# Patient Record
Sex: Male | Born: 1945 | Race: White | Hispanic: No | Marital: Married | Smoking: Former smoker
Health system: Southern US, Community
[De-identification: ages and names within clinical notes are randomized; demographics above are authoritative.]

## PROBLEM LIST (undated history)

## (undated) DIAGNOSIS — C679 Malignant neoplasm of bladder, unspecified: Secondary | ICD-10-CM

## (undated) DIAGNOSIS — I2510 Atherosclerotic heart disease of native coronary artery without angina pectoris: Secondary | ICD-10-CM

## (undated) DIAGNOSIS — N400 Enlarged prostate without lower urinary tract symptoms: Secondary | ICD-10-CM

## (undated) DIAGNOSIS — Z86711 Personal history of pulmonary embolism: Secondary | ICD-10-CM

## (undated) DIAGNOSIS — K551 Chronic vascular disorders of intestine: Secondary | ICD-10-CM

## (undated) DIAGNOSIS — I10 Essential (primary) hypertension: Secondary | ICD-10-CM

## (undated) DIAGNOSIS — E785 Hyperlipidemia, unspecified: Secondary | ICD-10-CM

## (undated) DIAGNOSIS — C8190 Hodgkin lymphoma, unspecified, unspecified site: Secondary | ICD-10-CM

## (undated) DIAGNOSIS — K219 Gastro-esophageal reflux disease without esophagitis: Secondary | ICD-10-CM

## (undated) HISTORY — DX: Benign prostatic hyperplasia without lower urinary tract symptoms: N40.0

## (undated) HISTORY — DX: Chronic vascular disorders of intestine: K55.1

## (undated) HISTORY — DX: Gastro-esophageal reflux disease without esophagitis: K21.9

## (undated) HISTORY — DX: Personal history of pulmonary embolism: Z86.711

## (undated) HISTORY — DX: Essential (primary) hypertension: I10

## (undated) HISTORY — DX: Hyperlipidemia, unspecified: E78.5

## (undated) HISTORY — PX: APPENDECTOMY: SHX54

## (undated) HISTORY — PX: KNEE ARTHROPLASTY: SHX992

## (undated) HISTORY — DX: Malignant neoplasm of bladder, unspecified: C67.9

## (undated) HISTORY — DX: Hodgkin lymphoma, unspecified, unspecified site: C81.90

---

## 1978-04-04 HISTORY — PX: SPLENECTOMY: SUR1306

## 2009-04-04 DIAGNOSIS — I2510 Atherosclerotic heart disease of native coronary artery without angina pectoris: Secondary | ICD-10-CM

## 2009-04-04 HISTORY — PX: CORONARY ARTERY BYPASS GRAFT: SHX141

## 2009-04-04 HISTORY — DX: Atherosclerotic heart disease of native coronary artery without angina pectoris: I25.10

## 2014-04-04 HISTORY — PX: MELANOMA EXCISION: SHX5266

## 2014-04-15 ENCOUNTER — Ambulatory Visit (INDEPENDENT_AMBULATORY_CARE_PROVIDER_SITE_OTHER): Payer: Medicare Other | Admitting: Gastroenterology

## 2014-04-15 ENCOUNTER — Encounter: Payer: Self-pay | Admitting: Gastroenterology

## 2014-04-15 VITALS — BP 110/70 | HR 60 | Ht 71.0 in | Wt 205.0 lb

## 2014-04-15 DIAGNOSIS — Z1211 Encounter for screening for malignant neoplasm of colon: Secondary | ICD-10-CM

## 2014-04-15 DIAGNOSIS — R112 Nausea with vomiting, unspecified: Secondary | ICD-10-CM | POA: Insufficient documentation

## 2014-04-15 DIAGNOSIS — Z87898 Personal history of other specified conditions: Secondary | ICD-10-CM | POA: Insufficient documentation

## 2014-04-15 DIAGNOSIS — R1013 Epigastric pain: Secondary | ICD-10-CM | POA: Insufficient documentation

## 2014-04-15 MED ORDER — ONDANSETRON 4 MG PO TBDP: 4.0000 mg | ORAL_TABLET | Freq: Three times a day (TID) | ORAL | Status: DC | PRN

## 2014-04-15 MED ORDER — MOVIPREP 100 G PO SOLR: 1.0000 | Freq: Once | ORAL | Status: DC

## 2014-04-15 NOTE — Progress Notes (Signed)
04/15/2014 Matthew Butler [id number] May 22, 1945   HISTORY OF PRESENT ILLNESS:  This is a pleasant 69 year old male who is new to our practice and presents to our office today with his wife with complaints of episodic epigastric abdominal pain and nausea.  He tells me that he was in his normal state of health until October when one night he woke up in the middle the night with severe epigastric abdominal pain. This lasted for a few hours before subsiding. The pain completely resolved and then he was feeling fine until symptoms recurred right after Christmas. Once again symptoms woke him from sleep at night with significant abdominal pain. Once again they improved after just a few hours until a third episode that occurred this past Friday. This time the pain lasted much longer. The pain has been associated with some nausea and a lot of belching. He did see his PCP on Saturday at which time some labs and an H. Pylori stool study were performed, however, those results are not back yet. He was placed on Nexium daily and Zantac at bedtime. He says that overall the pain has improved at this point be still feels nauseous and does not feel like eating. He has been eating bland food and drinking liquids, but even liquids make him nauseous at times. He denies any actual vomiting. He has been moving his bowels and passing flatus without issues.  Has lost 8 pounds since Friday due to not eating much, but denies any weight loss prior to this.  He reports that he does have a history of ulcer disease several years ago that were bleeding ulcers. He reports that he had H. Pylori infection which was treated at that time, however. He denies any NSAID use. He also has a past medical history of Hodgkin's lymphoma several years ago and has a large scar in his abdomen from a surgery that he had at that time for "cancer staging".   Past Medical History  Diagnosis Date  . Hodgkin disease    Past Surgical History  Procedure  Laterality Date  . Coronary artery bypass graft    . Knee arthroplasty    . Appendectomy      reports that he has quit smoking. His smoking use included Cigars. He has never used smokeless tobacco. He reports that he drinks alcohol. He reports that he does not use illicit drugs. family history includes Bowel Disease in his father; Congestive Heart Failure in his mother; Melanoma in his maternal grandmother. No Known Allergies    Outpatient Encounter Prescriptions as of 04/15/2014  Medication Sig  . aspirin 81 MG tablet Take 81 mg by mouth daily.  Marland Kitchen esomeprazole (NEXIUM) 20 MG capsule Take 40 mg by mouth daily at 12 noon.  . finasteride (PROSCAR) 5 MG tablet   . HYDROcodone-acetaminophen (NORCO/VICODIN) 5-325 MG per tablet   . lidocaine (XYLOCAINE) 2 % solution   . methylPREDNIsolone (MEDROL DOSPACK) 4 MG tablet   . metoprolol succinate (TOPROL-XL) 25 MG 24 hr tablet   . MOVIPREP 100 G SOLR Take 1 kit (200 g total) by mouth once.  . ondansetron (ZOFRAN ODT) 4 MG disintegrating tablet Take 1 tablet (4 mg total) by mouth every 8 (eight) hours as needed for nausea or vomiting.  . ranitidine (ZANTAC) 150 MG tablet   . simvastatin (ZOCOR) 40 MG tablet   . tamsulosin (FLOMAX) 0.4 MG CAPS capsule      REVIEW OF SYSTEMS  : All other systems reviewed and negative  except where noted in the History of Present Illness.   PHYSICAL EXAM: BP 110/70 mmHg  Pulse 60  Ht _0  (1.803 m)  Wt 205 lb (92.987 kg)  BMI 28.60 kg/m2 General: Well developed white male in no acute distress Head: Normocephalic and atraumatic Eyes:  Sclerae anicteric, conjunctiva pink. Ears: Normal auditory acuity Lungs: Clear throughout to auscultation Heart: Regular rate and rhythm Abdomen: Soft, non-distended.  Normal bowel sounds.  Mild epigastric TTP without R/R/G.  Mid-abdominal scar from previous surgery noted. Rectal:  Will be done at the time of colonoscopy. Musculoskeletal: Symmetrical with no gross  deformities  Skin: No lesions on visible extremities Extremities: No edema  Neurological: Alert oriented x 4, grossly non-focal Psychological:  Alert and cooperative. Normal mood and affect  ASSESSMENT AND PLAN: -Intermittent epigastric abdominal pain and nausea:  Sounds almost like biliary colic, but he does have a history of ulcer disease which were probably due to Hpylori infection in the past that was treated; denies NSAID use.  Also, has large mid-abdominal incision so it is possible that he is having PSBO intermittently as well.  Will check abdominal ultrasound and schedule EGD as well.  He will continue the Nexium daily and Zantac at bedtime that he is currently taking.  Will give him zofran to use prn for nausea.  May need HIDA scan vs CT scan pending results of the above studies.  We will also try to obtain lab results that were performed at his PCP's office this past weekend. -Screening colonoscopy:  Never had colonoscopy in the past.  Will schedule with Dr. Henrene Pastor.  *The risks, benefits, and alternatives to EGD and colonoscopy were discussed with the patient and he consents to proceed.

## 2014-04-15 NOTE — Progress Notes (Signed)
Agree with initial assessment and plans as outlined 

## 2014-04-15 NOTE — Patient Instructions (Addendum)
We have sent the following medications to your pharmacy for you to pick up at your convenience: Zofran You have been scheduled for an endoscopy and colonoscopy. Please follow the written instructions given to you at your visit today. Please pick up your prep at the pharmacy within the next 1-3 days. If you use inhalers (even only as needed), please bring them with you on the day of your procedure. Your physician has requested that you go to www.startemmi.com and enter the access code given to you at your visit today. This web site gives a general overview about your procedure. However, you should still follow specific instructions given to you by our office regarding your preparation for the procedure. You have been scheduled for an abdominal ultrasound at [facility] (1st floor of hospital) on 04/18/14 at 10 am. Please arrive 15 minutes prior to your appointment for registration. Make certain not to have anything to eat or drink 6 hours prior to your appointment. Should you need to reschedule your appointment, please contact radiology at [phone]. This test typically takes about 30 minutes to perform.

## 2014-04-16 ENCOUNTER — Emergency Department (HOSPITAL_COMMUNITY): Payer: Medicare Other

## 2014-04-16 ENCOUNTER — Inpatient Hospital Stay (HOSPITAL_COMMUNITY)
Admission: EM | Admit: 2014-04-16 | Discharge: 2014-04-20 | DRG: 418 | Disposition: A | Discharge status: Home / Self Care | Payer: Medicare Other | Attending: General Surgery | Admitting: General Surgery

## 2014-04-16 ENCOUNTER — Encounter (HOSPITAL_COMMUNITY): Payer: Self-pay | Admitting: *Deleted

## 2014-04-16 DIAGNOSIS — K8000 Calculus of gallbladder with acute cholecystitis without obstruction: Principal | ICD-10-CM | POA: Diagnosis present

## 2014-04-16 DIAGNOSIS — I10 Essential (primary) hypertension: Secondary | ICD-10-CM | POA: Diagnosis present

## 2014-04-16 DIAGNOSIS — Z8249 Family history of ischemic heart disease and other diseases of the circulatory system: Secondary | ICD-10-CM

## 2014-04-16 DIAGNOSIS — N179 Acute kidney failure, unspecified: Secondary | ICD-10-CM

## 2014-04-16 DIAGNOSIS — K219 Gastro-esophageal reflux disease without esophagitis: Secondary | ICD-10-CM

## 2014-04-16 DIAGNOSIS — R109 Unspecified abdominal pain: Secondary | ICD-10-CM

## 2014-04-16 DIAGNOSIS — E785 Hyperlipidemia, unspecified: Secondary | ICD-10-CM

## 2014-04-16 DIAGNOSIS — Z808 Family history of malignant neoplasm of other organs or systems: Secondary | ICD-10-CM | POA: Diagnosis not present

## 2014-04-16 DIAGNOSIS — Z87891 Personal history of nicotine dependence: Secondary | ICD-10-CM

## 2014-04-16 DIAGNOSIS — R740 Nonspecific elevation of levels of transaminase and lactic acid dehydrogenase [LDH]: Secondary | ICD-10-CM

## 2014-04-16 DIAGNOSIS — Z9081 Acquired absence of spleen: Secondary | ICD-10-CM | POA: Diagnosis present

## 2014-04-16 DIAGNOSIS — Z8571 Personal history of Hodgkin lymphoma: Secondary | ICD-10-CM

## 2014-04-16 DIAGNOSIS — R7401 Elevation of levels of liver transaminase levels: Secondary | ICD-10-CM

## 2014-04-16 DIAGNOSIS — Z7982 Long term (current) use of aspirin: Secondary | ICD-10-CM

## 2014-04-16 DIAGNOSIS — Z951 Presence of aortocoronary bypass graft: Secondary | ICD-10-CM

## 2014-04-16 DIAGNOSIS — I25810 Atherosclerosis of coronary artery bypass graft(s) without angina pectoris: Secondary | ICD-10-CM | POA: Diagnosis present

## 2014-04-16 DIAGNOSIS — N400 Enlarged prostate without lower urinary tract symptoms: Secondary | ICD-10-CM

## 2014-04-16 DIAGNOSIS — Z01818 Encounter for other preprocedural examination: Secondary | ICD-10-CM

## 2014-04-16 DIAGNOSIS — K819 Cholecystitis, unspecified: Secondary | ICD-10-CM

## 2014-04-16 DIAGNOSIS — R1011 Right upper quadrant pain: Secondary | ICD-10-CM | POA: Diagnosis present

## 2014-04-16 DIAGNOSIS — K810 Acute cholecystitis: Secondary | ICD-10-CM | POA: Diagnosis present

## 2014-04-16 HISTORY — DX: Atherosclerotic heart disease of native coronary artery without angina pectoris: I25.10

## 2014-04-16 LAB — COMPREHENSIVE METABOLIC PANEL
ALT: 69 U/L — ABNORMAL HIGH (ref 0–53)
AST: 130 U/L — ABNORMAL HIGH (ref 0–37)
Albumin: 3.7 g/dL (ref 3.5–5.2)
Alkaline Phosphatase: 147 U/L — ABNORMAL HIGH (ref 39–117)
Anion gap: 6 (ref 5–15)
BUN: 20 mg/dL (ref 6–23)
CO2: 30 mmol/L (ref 19–32)
Calcium: 8.8 mg/dL (ref 8.4–10.5)
Chloride: 100 mEq/L (ref 96–112)
Creatinine, Ser: 1.4 mg/dL — ABNORMAL HIGH (ref 0.50–1.35)
GFR calc Af Amer: 58 mL/min — ABNORMAL LOW (ref >90)
GFR calc non Af Amer: 50 mL/min — ABNORMAL LOW (ref >90)
Glucose, Bld: 105 mg/dL — ABNORMAL HIGH (ref 70–99)
Potassium: 4.1 mmol/L (ref 3.5–5.1)
Sodium: 136 mmol/L (ref 135–145)
Total Bilirubin: 1.4 mg/dL — ABNORMAL HIGH (ref 0.3–1.2)
Total Protein: 7.6 g/dL (ref 6.0–8.3)

## 2014-04-16 LAB — CBC WITH DIFFERENTIAL/PLATELET
Basophils Absolute: 0 10*3/uL (ref 0.0–0.1)
Basophils Relative: 0 % (ref 0–1)
Eosinophils Absolute: 0.1 10*3/uL (ref 0.0–0.7)
Eosinophils Relative: 0 % (ref 0–5)
HCT: 42.6 % (ref 39.0–52.0)
Hemoglobin: 14.4 g/dL (ref 13.0–17.0)
Lymphocytes Relative: 6 % — ABNORMAL LOW (ref 12–46)
Lymphs Abs: 1.1 10*3/uL (ref 0.7–4.0)
MCH: 31.5 pg (ref 26.0–34.0)
MCHC: 33.8 g/dL (ref 30.0–36.0)
MCV: 93.2 fL (ref 78.0–100.0)
Monocytes Absolute: 1.7 10*3/uL — ABNORMAL HIGH (ref 0.1–1.0)
Monocytes Relative: 9 % (ref 3–12)
Neutro Abs: 16 10*3/uL — ABNORMAL HIGH (ref 1.7–7.7)
Neutrophils Relative %: 85 % — ABNORMAL HIGH (ref 43–77)
Platelets: 390 10*3/uL (ref 150–400)
RBC: 4.57 MIL/uL (ref 4.22–5.81)
RDW: 13.8 % (ref 11.5–15.5)
WBC: 18.9 10*3/uL — ABNORMAL HIGH (ref 4.0–10.5)

## 2014-04-16 LAB — LIPASE, BLOOD: Lipase: 26 U/L (ref 11–59)

## 2014-04-16 LAB — ETHANOL: Alcohol, Ethyl (B): <5 mg/dL (ref 0–9)

## 2014-04-16 MED ORDER — SODIUM CHLORIDE 0.9 % IV SOLN
3.0000 g | Freq: Three times a day (TID) | INTRAVENOUS | Status: DC
  Administered 2014-04-17 – 2014-04-20 (×11): 3 g via INTRAVENOUS
  Filled 2014-04-16 (×14): qty 3

## 2014-04-16 MED ORDER — ONDANSETRON HCL 4 MG/2ML IJ SOLN
4.0000 mg | Freq: Once | INTRAMUSCULAR | Status: AC
  Administered 2014-04-16: 4 mg via INTRAVENOUS
  Filled 2014-04-16: qty 2

## 2014-04-16 MED ORDER — PANTOPRAZOLE SODIUM 40 MG PO TBEC
40.0000 mg | DELAYED_RELEASE_TABLET | Freq: Every day | ORAL | Status: DC
  Administered 2014-04-17 – 2014-04-20 (×4): 40 mg via ORAL
  Filled 2014-04-16 (×4): qty 1

## 2014-04-16 MED ORDER — ONDANSETRON HCL 4 MG/2ML IJ SOLN: 4.0000 mg | Freq: Three times a day (TID) | INTRAMUSCULAR | Status: DC | PRN

## 2014-04-16 MED ORDER — MORPHINE SULFATE 4 MG/ML IJ SOLN
2.0000 mg | INTRAMUSCULAR | Status: DC | PRN
  Administered 2014-04-16: 2 mg via INTRAVENOUS
  Filled 2014-04-16: qty 1

## 2014-04-16 MED ORDER — HYDROMORPHONE HCL 1 MG/ML IJ SOLN
0.5000 mg | INTRAMUSCULAR | Status: AC | PRN
Start: 2014-04-16 — End: 2014-04-17
  Administered 2014-04-17: 0.5 mg via INTRAVENOUS
  Filled 2014-04-16: qty 1

## 2014-04-16 MED ORDER — SIMVASTATIN 20 MG PO TABS
40.0000 mg | ORAL_TABLET | Freq: Every day | ORAL | Status: DC
  Administered 2014-04-17: 40 mg via ORAL
  Filled 2014-04-16: qty 2

## 2014-04-16 MED ORDER — TAMSULOSIN HCL 0.4 MG PO CAPS
0.4000 mg | ORAL_CAPSULE | Freq: Every day | ORAL | Status: DC
  Administered 2014-04-17 – 2014-04-20 (×4): 0.4 mg via ORAL
  Filled 2014-04-16 (×4): qty 1

## 2014-04-16 MED ORDER — SODIUM CHLORIDE 0.9 % IV SOLN
INTRAVENOUS | Status: DC
  Administered 2014-04-16 – 2014-04-17 (×3): via INTRAVENOUS

## 2014-04-16 MED ORDER — SENNA 8.6 MG PO TABS
1.0000 | ORAL_TABLET | Freq: Two times a day (BID) | ORAL | Status: DC
  Administered 2014-04-16 – 2014-04-20 (×6): 8.6 mg via ORAL
  Filled 2014-04-16 (×12): qty 1

## 2014-04-16 MED ORDER — ASPIRIN EC 81 MG PO TBEC: 81.0000 mg | DELAYED_RELEASE_TABLET | Freq: Every day | ORAL | Status: DC

## 2014-04-16 MED ORDER — SODIUM CHLORIDE 0.9 % IV SOLN
Freq: Once | INTRAVENOUS | Status: AC
  Administered 2014-04-16: 17:00:00 via INTRAVENOUS

## 2014-04-16 MED ORDER — SODIUM CHLORIDE 0.9 % IV SOLN
3.0000 g | Freq: Once | INTRAVENOUS | Status: AC
  Administered 2014-04-16: 3 g via INTRAVENOUS
  Filled 2014-04-16: qty 3

## 2014-04-16 MED ORDER — AMPICILLIN-SULBACTAM SODIUM 3 (2-1) G IJ SOLR
INTRAMUSCULAR | Status: AC
  Filled 2014-04-16: qty 3

## 2014-04-16 MED ORDER — ASPIRIN EC 81 MG PO TBEC
81.0000 mg | DELAYED_RELEASE_TABLET | Freq: Every day | ORAL | Status: DC
  Administered 2014-04-17: 81 mg via ORAL
  Filled 2014-04-16: qty 1

## 2014-04-16 MED ORDER — SODIUM CHLORIDE 0.9 % IV SOLN
3.0000 g | Freq: Three times a day (TID) | INTRAVENOUS | Status: DC
  Filled 2014-04-16 (×2): qty 3

## 2014-04-16 MED ORDER — MORPHINE SULFATE 4 MG/ML IJ SOLN: 4.0000 mg | INTRAMUSCULAR | Status: DC | PRN

## 2014-04-16 MED ORDER — PANTOPRAZOLE SODIUM 40 MG PO TBEC: 40.0000 mg | DELAYED_RELEASE_TABLET | Freq: Every day | ORAL | Status: DC

## 2014-04-16 MED ORDER — FINASTERIDE 5 MG PO TABS
5.0000 mg | ORAL_TABLET | Freq: Every day | ORAL | Status: DC
Start: 2014-04-17 — End: 2014-04-20
  Administered 2014-04-17 – 2014-04-20 (×4): 5 mg via ORAL
  Filled 2014-04-16 (×7): qty 1

## 2014-04-16 MED ORDER — POLYETHYLENE GLYCOL 3350 17 G PO PACK
17.0000 g | PACK | Freq: Every day | ORAL | Status: DC | PRN
  Administered 2014-04-20: 17 g via ORAL
  Filled 2014-04-16: qty 1

## 2014-04-16 MED ORDER — HEPARIN SODIUM (PORCINE) 5000 UNIT/ML IJ SOLN
5000.0000 [IU] | Freq: Three times a day (TID) | INTRAMUSCULAR | Status: DC
  Administered 2014-04-16 – 2014-04-17 (×4): 5000 [IU] via SUBCUTANEOUS
  Filled 2014-04-16 (×4): qty 1

## 2014-04-16 MED ORDER — METOPROLOL SUCCINATE ER 25 MG PO TB24
25.0000 mg | ORAL_TABLET | Freq: Every day | ORAL | Status: DC
Start: 2014-04-17 — End: 2014-04-20
  Administered 2014-04-17 – 2014-04-20 (×3): 25 mg via ORAL
  Filled 2014-04-16 (×5): qty 1

## 2014-04-16 NOTE — ED Notes (Addendum)
RUQ pain with nausea at times. Pt sent from [facility] in [place] due to suspecting cholecystitis. Toradol 60mg  IM was given in the office. Pt has GBUS scheduled for Friday. Pt has lab work from [facility].

## 2014-04-16 NOTE — ED Notes (Signed)
Pt reporting mild nausea, but improvement in pain levels.  VS stable, no distress noted at present time.

## 2014-04-16 NOTE — H&P (Signed)
[facility] History and Physical  Ziv Welchel TMH:[id number] DOB: [birth year] DOA: 04/16/2014  Referring physician: Dr. Sabra Heck - [facility] PCP: PROVIDER NOT IN SYSTEM   Chief Complaint: abd pain  HPI: Matthew Butler is a 69 y.o. male  Abdominal pain started 1 wk ago. RUQ. No radiation.  Getting worse. Comes and goes - lasts 2-3 hours. Associate w/ nausea. Worse w/ food. Current episode started around 14:00. Typically pain is at night. Tried toradol w/ mild relief. Went to UC and told WBC 15 and needed to come to ED. H/o Hodgkin dlymphoma s/p splenectomy and appendectomy.   Review of Systems:  Constitutional:  No weight loss, night sweats, Fevers, chills  HEENT:  No headaches, Difficulty swallowing,Tooth/dental problems,Sore throat,  No sneezing, itching, ear ache, nasal congestion, post nasal drip,  Cardio-vascular:  No chest pain, Orthopnea, PND, swelling in lower extremities, anasarca, dizziness, palpitations  GI:  Per HPI Resp:   No shortness of breath with exertion or at rest. No excess mucus, no productive cough, No non-productive cough, No coughing up of blood.No change in color of mucus.No wheezing.No chest wall deformity  Skin:  no rash or lesions.  GU:  no dysuria, change in color of urine, no urgency or frequency. No flank pain.  Musculoskeletal:   No joint pain or swelling. No decreased range of motion. No back pain.  Psych:  No change in mood or affect. No depression or anxiety. No memory loss.   Past Medical History  Diagnosis Date  . Hodgkin disease    Past Surgical History  Procedure Laterality Date  . Coronary artery bypass graft    . Knee arthroplasty    . Appendectomy    . Coronary artery bypass graft     Social History:  reports that he has quit smoking. His smoking use included Cigars. He has never used smokeless tobacco. He reports that he drinks alcohol. He reports that he does not use illicit drugs.  No Known Allergies  Family History  Problem  Relation Age of Onset  . Melanoma Maternal Grandmother   . Congestive Heart Failure Mother   . Bowel Disease Father      Prior to Admission medications   Medication Sig Start Date End Date Taking? Authorizing Provider  aspirin EC 81 MG tablet Take 81 mg by mouth daily.   Yes Historical Provider, MD  esomeprazole (NEXIUM) 20 MG capsule Take 40 mg by mouth daily.    Yes Historical Provider, MD  finasteride (PROSCAR) 5 MG tablet  02/24/14  Yes Historical Provider, MD  metoprolol succinate (TOPROL-XL) 25 MG 24 hr tablet Take 25 mg by mouth daily.  02/24/14  Yes Historical Provider, MD  ranitidine (ZANTAC) 150 MG tablet  04/12/14  Yes Historical Provider, MD  simvastatin (ZOCOR) 40 MG tablet  02/24/14  Yes Historical Provider, MD  tamsulosin (FLOMAX) 0.4 MG CAPS capsule  02/24/14  Yes Historical Provider, MD  HYDROcodone-acetaminophen (NORCO/VICODIN) 5-325 MG per tablet  03/05/14   Historical Provider, MD  MOVIPREP 100 G SOLR Take 1 kit (200 g total) by mouth once. 04/15/14   Janett Billow D. Zehr, PA-C  ondansetron (ZOFRAN ODT) 4 MG disintegrating tablet Take 1 tablet (4 mg total) by mouth every 8 (eight) hours as needed for nausea or vomiting. 04/15/14   Laban Emperor. Zehr, PA-C   Physical Exam: Filed Vitals:   04/16/14 1611 04/16/14 1917  BP: 108/60 114/56  Pulse: 111   Temp: 98 F (36.7 C) 98.9 F (37.2 C)  TempSrc: Oral Oral  Resp: 16 20  Height: '5\' 11"'  (1.803 m)   Weight: 89.812 kg (198 lb)   SpO2: 96% 97%    Wt Readings from Last 3 Encounters:  04/16/14 89.812 kg (198 lb)  04/15/14 92.987 kg (205 lb)    General:  Appears calm and comfortable Eyes:  PERRL, normal lids, irises & conjunctiva ENT:  grossly normal hearing, lips & tongue Neck:  no LAD, masses or thyromegaly Cardiovascular:  RRR, no m/r/g. No LE edema. Telemetry:  SR, no arrhythmias  Respiratory:  CTA bilaterally, no w/r/r. Normal respiratory effort. Abdomen:  + Murphy's sign. NABS.  Skin:  no rash or induration seen on  limited exam Musculoskeletal:  grossly normal tone BUE/BLE Psychiatric:  grossly normal mood and affect, speech fluent and appropriate Neurologic:  grossly non-focal.          Labs on Admission:  Basic Metabolic Panel:  Recent Labs Lab 04/16/14 1700  NA 136  K 4.1  CL 100  CO2 30  GLUCOSE 105*  BUN 20  CREATININE 1.40*  CALCIUM 8.8   Liver Function Tests:  Recent Labs Lab 04/16/14 1700  AST 130*  ALT 69*  ALKPHOS 147*  BILITOT 1.4*  PROT 7.6  ALBUMIN 3.7    Recent Labs Lab 04/16/14 1700  LIPASE 26   No results for input(s): AMMONIA in the last 168 hours. CBC:  Recent Labs Lab 04/16/14 1700  WBC 18.9*  NEUTROABS 16.0*  HGB 14.4  HCT 42.6  MCV 93.2  PLT 390   Cardiac Enzymes: No results for input(s): CKTOTAL, CKMB, CKMBINDEX, TROPONINI in the last 168 hours.  BNP (last 3 results) No results for input(s): PROBNP in the last 8760 hours. CBG: No results for input(s): GLUCAP in the last 168 hours.  Radiological Exams on Admission: US Abdomen Limited Ruq  04/16/2014   CLINICAL DATA:  Acute right upper quadrant pain with nausea and vomiting for 1 week.  EXAM: US ABDOMEN LIMITED - RIGHT UPPER QUADRANT  COMPARISON:  None.  FINDINGS: Gallbladder:  Hypoechoic intraluminal gallbladder sludge noted. This appears mobile. Difficult to exclude small gallstones within the sludge. Gallbladder wall is mildly thickened measuring 6 mm. No pericholecystic fluid. No elicited sonographic Murphy sign. Appearance remains nonspecific for cholecystitis.  Common bile duct:  Diameter: 6.4 mm  Liver:  No focal lesion identified. Within normal limits in parenchymal echogenicity.  IMPRESSION: Heterogeneous intraluminal gallbladder sludge and suspect small gallstones. Associated mild gallbladder wall thickening. Despite this, no associated Murphys sign. Appearance remains equivocal for cholecystitis.  No biliary dilatation.   Electronically Signed   By: Daryll Brod M.D.   On: 04/16/2014  18:11    EKG: Independently reviewed. Sinus, tachy, no sign of ACS  Assessment/Plan Principal Problem:   Acute cholecystitis Active Problems:   BPH (benign prostatic hyperplasia)   AKI (acute kidney injury)   Benign essential HTN   HLD (hyperlipidemia)   GERD (gastroesophageal reflux disease)   Transaminitis   CHolecystitis: ABD [place] and symptoms consistent w/ cholecystitis. Afebrile and stable. ED consulted Dr. Arnoldo Morale who has agreed to see the pt (likely 04/17/14). Pt ok to eat and will likely go to osurgery on 04/19/14. ALk phos 147, AST 130, ALT 69, WBC 8.9 - ADmit - f/u Surgery consult - appreciate their input - OK for diet for now - continue Unasyn' - Morphine - zofron - ETOH level  AKI: Cr 1.4. No previous h/o renal failure. Pt likely dehydrated from poor po and GI loss.  - NS 113m/hr -  BMET in am  HTN: hypotensive  - continue home metoprolol in am - continue ASA  BPH:  - continue proscar and flomax  GERD:   - continue PPI  HLD:  - continue Zocor  Code Status: FULL  DVT Prophylaxis: Hep Family Communication: none Disposition Plan: pending improvement    Robinn Overholt Lenna Sciara, MD Family Medicine [facility] [email or web address] Password [email or web address]

## 2014-04-16 NOTE — ED Provider Notes (Signed)
CSN: [id number]     Arrival date & time 04/16/14  1556 History  This chart was scribed for Johnna Acosta, MD by Tula Nakayama, ED Scribe. This patient was seen in room [facility]14/[facility]14 and the patient's care was started at 4:42 PM.    Chief Complaint  Patient presents with  . Abnormal Lab    The history is provided by the patient. No language interpreter was used.    HPI Comments: Matthew Butler is a 69 y.o. male who presents to the Emergency Department complaining of intermittent RUQ pain that started 1 week ago. He reports that current pain is 5/10 and began 2 hours ago. Pt states that the pain typically lasts for 2-3 hours, goes away without treatment and occurs mostly at night. Pt was initially scheduled for an endoscopy at [facility] earlier this week because pain was epigastric, but notes pain has moved. He went to [facility] and had lab work which showed a [place] [address]. Pt has a history of an exploratory laparoscopy for Hodgkin Disease, splenectomy and appendectomy. He denies taking anticoagulants, but takes 1 baby Aspirin daily. Pt also took 1 Toradol PTA with mild relief.  Past Medical History  Diagnosis Date  . Hodgkin disease    Past Surgical History  Procedure Laterality Date  . Coronary artery bypass graft    . Knee arthroplasty    . Appendectomy    . Coronary artery bypass graft     Family History  Problem Relation Age of Onset  . Melanoma Maternal Grandmother   . Congestive Heart Failure Mother   . Bowel Disease Father    History  Substance Use Topics  . Smoking status: Former Smoker    Types: Cigars  . Smokeless tobacco: Never Used  . Alcohol Use: 0.0 oz/week    0 Not specified per week     Comment: occassional    Review of Systems  Gastrointestinal: Positive for nausea and abdominal pain.  All other systems reviewed and are negative.  Allergies  Review of patient's allergies indicates no known allergies.  Home Medications   Prior to Admission  medications   Medication Sig Start Date End Date Taking? Authorizing Provider  aspirin EC 81 MG tablet Take 81 mg by mouth daily.   Yes Historical Provider, MD  esomeprazole (NEXIUM) 20 MG capsule Take 40 mg by mouth daily.    Yes Historical Provider, MD  finasteride (PROSCAR) 5 MG tablet  02/24/14  Yes Historical Provider, MD  metoprolol succinate (TOPROL-XL) 25 MG 24 hr tablet Take 25 mg by mouth daily.  02/24/14  Yes Historical Provider, MD  ranitidine (ZANTAC) 150 MG tablet  04/12/14  Yes Historical Provider, MD  simvastatin (ZOCOR) 40 MG tablet  02/24/14  Yes Historical Provider, MD  tamsulosin (FLOMAX) 0.4 MG CAPS capsule  02/24/14  Yes Historical Provider, MD  HYDROcodone-acetaminophen (NORCO/VICODIN) 5-325 MG per tablet  03/05/14   Historical Provider, MD  MOVIPREP 100 G SOLR Take 1 kit (200 g total) by mouth once. 04/15/14   Janett Billow D. Zehr, PA-C  ondansetron (ZOFRAN ODT) 4 MG disintegrating tablet Take 1 tablet (4 mg total) by mouth every 8 (eight) hours as needed for nausea or vomiting. 04/15/14   Laban Emperor. Zehr, PA-C   BP 108/60 mmHg  Pulse 111  Temp(Src) 98 F (36.7 C) (Oral)  Resp 16  Ht _0  (1.803 m)  Wt 198 lb (89.812 kg)  BMI 27.63 kg/m2  SpO2 96% Physical Exam  Constitutional: He appears well-developed and well-nourished. No distress.  HENT:  Head: Normocephalic and atraumatic.  Mouth/Throat: Oropharynx is clear and moist. No oropharyngeal exudate.  No jaundice of the eyes  Eyes: Conjunctivae and EOM are normal. Pupils are equal, round, and reactive to light. Right eye exhibits no discharge. Left eye exhibits no discharge. No scleral icterus.  Neck: Normal range of motion. Neck supple. No JVD present. No thyromegaly present.  Cardiovascular: Normal rate, regular rhythm, normal heart sounds and intact distal pulses.  Exam reveals no gallop and no friction rub.   No murmur heard. Mild tachycardia  Pulmonary/Chest: Effort normal and breath sounds normal. No respiratory  distress. He has no wheezes. He has no rales.  Abdominal: Soft. Bowel sounds are normal. He exhibits no distension and no mass. There is no tenderness.  Hyperactive bowel sounds; tympanic sounds in lower abdomen and right upper abdomen; RUQ tenderness; no Murphy's sign  Musculoskeletal: Normal range of motion. He exhibits no edema or tenderness.  Lymphadenopathy:    He has no cervical adenopathy.  Neurological: He is alert. Coordination normal.  Skin: Skin is warm and dry. No rash noted. No erythema.  Psychiatric: He has a normal mood and affect. His behavior is normal.  Nursing note and vitals reviewed.   ED Course  Procedures (including critical care time) DIAGNOSTIC STUDIES: Oxygen Saturation is 96% on RA, normal by my interpretation.    COORDINATION OF CARE: 4:50 PM Discussed treatment plan with pt at bedside and pt agreed to plan.  Labs Review Labs Reviewed  COMPREHENSIVE METABOLIC PANEL - Abnormal; Notable for the following:    Glucose, Bld 105 (*)    Creatinine, Ser 1.40 (*)    AST 130 (*)    ALT 69 (*)    Alkaline Phosphatase 147 (*)    Total Bilirubin 1.4 (*)    GFR calc non Af Amer 50 (*)    GFR calc Af Amer 58 (*)    All other components within normal limits  CBC WITH DIFFERENTIAL - Abnormal; Notable for the following:    WBC 18.9 (*)    Neutrophils Relative % 85 (*)    Neutro Abs 16.0 (*)    Lymphocytes Relative 6 (*)    Monocytes Absolute 1.7 (*)    All other components within normal limits  LIPASE, BLOOD  HEPATIC FUNCTION PANEL  CBC  BASIC METABOLIC PANEL    Imaging Review US Abdomen Limited Ruq  04/16/2014   CLINICAL DATA:  Acute right upper quadrant pain with nausea and vomiting for 1 week.  EXAM: US ABDOMEN LIMITED - RIGHT UPPER QUADRANT  COMPARISON:  None.  FINDINGS: Gallbladder:  Hypoechoic intraluminal gallbladder sludge noted. This appears mobile. Difficult to exclude small gallstones within the sludge. Gallbladder wall is mildly thickened measuring  6 mm. No pericholecystic fluid. No elicited sonographic Murphy sign. Appearance remains nonspecific for cholecystitis.  Common bile duct:  Diameter: 6.4 mm  Liver:  No focal lesion identified. Within normal limits in parenchymal echogenicity.  IMPRESSION: Heterogeneous intraluminal gallbladder sludge and suspect small gallstones. Associated mild gallbladder wall thickening. Despite this, no associated Murphys sign. Appearance remains equivocal for cholecystitis.  No biliary dilatation.   Electronically Signed   By: Daryll Brod M.D.   On: 04/16/2014 18:11      MDM   Final diagnoses:  Abdominal pain  Acute cholecystitis    The patient has persistent right upper quadrant pain, mild tachycardia, white blood cell count of 19,000 and an ultrasound that is  consistent with mild acute cholecystitis, no signs of perforation, mild liver elevation, this was discussed with general surgeon Dr. Arnoldo Morale as well as the internal medicine hospitalist Dr. Barbaraann Faster, the latter of who will admit to the hospital.  Unasyn, pain medication, fluids, patient will be able to eat this evening, recommended clear liquids as he is still having pain.  Meds given in ED:  Medications  morphine 4 MG/ML injection 2 mg (2 mg Intravenous Given 04/16/14 1703)  Ampicillin-Sulbactam (UNASYN) 3 g in sodium chloride 0.9 % 100 mL IVPB (3 g Intravenous New Bag/Given 04/16/14 1839)  ondansetron (ZOFRAN) injection 4 mg (4 mg Intravenous Given 04/16/14 1701)  0.9 %  sodium chloride infusion ( Intravenous New Bag/Given 04/16/14 1708)    New Prescriptions   No medications on file     I personally performed the services described in this documentation, which was scribed in my presence. The recorded information has been reviewed and is accurate.      Johnna Acosta, MD 04/16/14 Drema Halon

## 2014-04-17 ENCOUNTER — Inpatient Hospital Stay (HOSPITAL_COMMUNITY): Payer: Medicare Other

## 2014-04-17 DIAGNOSIS — I25810 Atherosclerosis of coronary artery bypass graft(s) without angina pectoris: Secondary | ICD-10-CM

## 2014-04-17 LAB — COMPREHENSIVE METABOLIC PANEL
ALT: 132 U/L — ABNORMAL HIGH (ref 0–53)
AST: 127 U/L — ABNORMAL HIGH (ref 0–37)
Albumin: 2.9 g/dL — ABNORMAL LOW (ref 3.5–5.2)
Alkaline Phosphatase: 144 U/L — ABNORMAL HIGH (ref 39–117)
Anion gap: 6 (ref 5–15)
BUN: 17 mg/dL (ref 6–23)
CO2: 29 mmol/L (ref 19–32)
Calcium: 8.4 mg/dL (ref 8.4–10.5)
Chloride: 101 mEq/L (ref 96–112)
Creatinine, Ser: 1.06 mg/dL (ref 0.50–1.35)
GFR calc Af Amer: 81 mL/min — ABNORMAL LOW (ref >90)
GFR calc non Af Amer: 70 mL/min — ABNORMAL LOW (ref >90)
Glucose, Bld: 104 mg/dL — ABNORMAL HIGH (ref 70–99)
Potassium: 3.7 mmol/L (ref 3.5–5.1)
Sodium: 136 mmol/L (ref 135–145)
Total Bilirubin: 2.3 mg/dL — ABNORMAL HIGH (ref 0.3–1.2)
Total Protein: 6.2 g/dL (ref 6.0–8.3)

## 2014-04-17 LAB — BILIRUBIN, DIRECT: Bilirubin, Direct: 1 mg/dL — ABNORMAL HIGH (ref 0.0–0.3)

## 2014-04-17 LAB — CBC
HCT: 37.2 % — ABNORMAL LOW (ref 39.0–52.0)
Hemoglobin: 12.5 g/dL — ABNORMAL LOW (ref 13.0–17.0)
MCH: 31.6 pg (ref 26.0–34.0)
MCHC: 33.6 g/dL (ref 30.0–36.0)
MCV: 93.9 fL (ref 78.0–100.0)
Platelets: 389 10*3/uL (ref 150–400)
RBC: 3.96 MIL/uL — ABNORMAL LOW (ref 4.22–5.81)
RDW: 13.9 % (ref 11.5–15.5)
WBC: 10.1 10*3/uL (ref 4.0–10.5)

## 2014-04-17 MED ORDER — HYDROMORPHONE HCL 1 MG/ML IJ SOLN
0.5000 mg | INTRAMUSCULAR | Status: DC | PRN
  Administered 2014-04-17: 0.5 mg via INTRAVENOUS
  Filled 2014-04-17: qty 1

## 2014-04-17 MED ORDER — GADOBENATE DIMEGLUMINE 529 MG/ML IV SOLN
19.0000 mL | Freq: Once | INTRAVENOUS | Status: AC | PRN
  Administered 2014-04-17: 19 mL via INTRAVENOUS

## 2014-04-17 NOTE — Progress Notes (Signed)
Reason for Consult: Cholecystitis, cholelithiasis Referring Physician: Hospitalist  Matthew Butler is an 69 y.o. male.  HPI: Patient is a 69 year old white male who presented to the emergency room with worsening right upper quadrant abdominal pain, nausea, vomiting. He has states he has had similar episodes in the past. Cardiac workup was negative. Ultrasound the gallbladder reveals cholelithiasis with a common bile duct which is at the high limit of normal. His LFTs are elevated. This morning, his total bilirubin has increased since admission. His leukocytosis has resolved.  Past Medical History  Diagnosis Date  . Hodgkin disease     Past Surgical History  Procedure Laterality Date  . Coronary artery bypass graft    . Knee arthroplasty    . Appendectomy    . Coronary artery bypass graft      Family History  Problem Relation Age of Onset  . Melanoma Maternal Grandmother   . Congestive Heart Failure Mother   . Bowel Disease Father     Social History:  reports that he has quit smoking. His smoking use included Cigars. He has never used smokeless tobacco. He reports that he drinks alcohol. He reports that he does not use illicit drugs.  Allergies: No Known Allergies  Medications: I have reviewed the patient's current medications.  Results for orders placed or performed during the hospital encounter of 04/16/14 (from the past 48 hour(s))  Comprehensive metabolic panel     Status: Abnormal   Collection Time: 04/16/14  5:00 PM  Result Value Ref Range   Sodium 136 135 - 145 mmol/L    Comment: Please note change in reference range.   Potassium 4.1 3.5 - 5.1 mmol/L    Comment: Please note change in reference range.   Chloride 100 96 - 112 mEq/L   CO2 30 19 - 32 mmol/L   Glucose, Bld 105 (H) 70 - 99 mg/dL   BUN 20 6 - 23 mg/dL   Creatinine, Ser 1.40 (H) 0.50 - 1.35 mg/dL   Calcium 8.8 8.4 - 10.5 mg/dL   Total Protein 7.6 6.0 - 8.3 g/dL   Albumin 3.7 3.5 - 5.2 g/dL   AST 130 (H) 0 -  37 U/L   ALT 69 (H) 0 - 53 U/L   Alkaline Phosphatase 147 (H) 39 - 117 U/L   Total Bilirubin 1.4 (H) 0.3 - 1.2 mg/dL   GFR calc non Af Amer 50 (L) >90 mL/min   GFR calc Af Amer 58 (L) >90 mL/min    Comment: (NOTE) The eGFR has been calculated using the CKD EPI equation. This calculation has not been validated in all clinical situations. eGFR's persistently <90 mL/min signify possible Chronic Kidney Disease.    Anion gap 6 5 - 15  CBC with Differential     Status: Abnormal   Collection Time: 04/16/14  5:00 PM  Result Value Ref Range   WBC 18.9 (H) 4.0 - 10.5 K/uL   RBC 4.57 4.22 - 5.81 MIL/uL   Hemoglobin 14.4 13.0 - 17.0 g/dL   HCT 42.6 39.0 - 52.0 %   MCV 93.2 78.0 - 100.0 fL   MCH 31.5 26.0 - 34.0 pg   MCHC 33.8 30.0 - 36.0 g/dL   RDW 13.8 11.5 - 15.5 %   Platelets 390 150 - 400 K/uL   Neutrophils Relative % 85 (H) 43 - 77 %   Neutro Abs 16.0 (H) 1.7 - 7.7 K/uL   Lymphocytes Relative 6 (L) 12 - 46 %   Lymphs Abs  1.1 0.7 - 4.0 K/uL   Monocytes Relative 9 3 - 12 %   Monocytes Absolute 1.7 (H) 0.1 - 1.0 K/uL   Eosinophils Relative 0 0 - 5 %   Eosinophils Absolute 0.1 0.0 - 0.7 K/uL   Basophils Relative 0 0 - 1 %   Basophils Absolute 0.0 0.0 - 0.1 K/uL  Lipase, blood     Status: None   Collection Time: 04/16/14  5:00 PM  Result Value Ref Range   Lipase 26 11 - 59 U/L  Ethanol     Status: None   Collection Time: 04/16/14  5:00 PM  Result Value Ref Range   Alcohol, Ethyl (B) <5 0 - 9 mg/dL    Comment:        LOWEST DETECTABLE LIMIT FOR SERUM ALCOHOL IS 11 mg/dL FOR MEDICAL PURPOSES ONLY   Comprehensive metabolic panel     Status: Abnormal   Collection Time: 04/17/14  6:01 AM  Result Value Ref Range   Sodium 136 135 - 145 mmol/L    Comment: Please note change in reference range.   Potassium 3.7 3.5 - 5.1 mmol/L    Comment: Please note change in reference range.   Chloride 101 96 - 112 mEq/L   CO2 29 19 - 32 mmol/L   Glucose, Bld 104 (H) 70 - 99 mg/dL   BUN 17 6 -  23 mg/dL   Creatinine, Ser 1.06 0.50 - 1.35 mg/dL   Calcium 8.4 8.4 - 10.5 mg/dL   Total Protein 6.2 6.0 - 8.3 g/dL   Albumin 2.9 (L) 3.5 - 5.2 g/dL   AST 127 (H) 0 - 37 U/L   ALT 132 (H) 0 - 53 U/L   Alkaline Phosphatase 144 (H) 39 - 117 U/L   Total Bilirubin 2.3 (H) 0.3 - 1.2 mg/dL   GFR calc non Af Amer 70 (L) >90 mL/min   GFR calc Af Amer 81 (L) >90 mL/min    Comment: (NOTE) The eGFR has been calculated using the CKD EPI equation. This calculation has not been validated in all clinical situations. eGFR's persistently <90 mL/min signify possible Chronic Kidney Disease.    Anion gap 6 5 - 15  CBC     Status: Abnormal   Collection Time: 04/17/14  6:01 AM  Result Value Ref Range   WBC 10.1 4.0 - 10.5 K/uL   RBC 3.96 (L) 4.22 - 5.81 MIL/uL   Hemoglobin 12.5 (L) 13.0 - 17.0 g/dL   HCT 37.2 (L) 39.0 - 52.0 %   MCV 93.9 78.0 - 100.0 fL   MCH 31.6 26.0 - 34.0 pg   MCHC 33.6 30.0 - 36.0 g/dL   RDW 13.9 11.5 - 15.5 %   Platelets 389 150 - 400 K/uL    Dg Chest 2 View  04/16/2014   CLINICAL DATA:  Acute right upper quadrant pain with nausea for 1 week  EXAM: CHEST  2 VIEW  COMPARISON:  None.  FINDINGS: Prior coronary bypass changes noted. Normal heart size and vascularity. Low lung volumes with slight elevation of right hemidiaphragm. No superimposed pneumonia, collapse or consolidation. Negative for edema, effusion or pneumothorax. Trachea midline.  IMPRESSION: Prior coronary bypass.  Low lung volumes without acute process.   Electronically Signed   By: Daryll Brod M.D.   On: 04/16/2014 19:44   US Abdomen Limited Ruq  04/16/2014   CLINICAL DATA:  Acute right upper quadrant pain with nausea and vomiting for 1 week.  EXAM: US ABDOMEN LIMITED -  RIGHT UPPER QUADRANT  COMPARISON:  None.  FINDINGS: Gallbladder:  Hypoechoic intraluminal gallbladder sludge noted. This appears mobile. Difficult to exclude small gallstones within the sludge. Gallbladder wall is mildly thickened measuring 6 mm. No  pericholecystic fluid. No elicited sonographic Murphy sign. Appearance remains nonspecific for cholecystitis.  Common bile duct:  Diameter: 6.4 mm  Liver:  No focal lesion identified. Within normal limits in parenchymal echogenicity.  IMPRESSION: Heterogeneous intraluminal gallbladder sludge and suspect small gallstones. Associated mild gallbladder wall thickening. Despite this, no associated Murphys sign. Appearance remains equivocal for cholecystitis.  No biliary dilatation.   Electronically Signed   By: Daryll Brod M.D.   On: 04/16/2014 18:11    ROS: See chart Blood pressure 113/59, pulse 75, temperature 98.3 F (36.8 C), temperature source Oral, resp. rate 18, height '5\' 11"'  (1.803 m), weight 91.5 kg (201 lb 11.5 oz), SpO2 94 %. Physical Exam: Pleasant white male in no acute distress. Abdomen is soft with tenderness noted in the right upper quadrant to deep palpation. No hepatosplenomegaly, masses, hernias identified. No rigidity is identified.  Assessment/Plan: Impression: Cholecystitis, cholelithiasis, elevated total bilirubin Plan: We'll get an MRCP today to further evaluate, bile duct. Further management is pending those results.  Matthew Butler A 04/17/2014, 9:11 AM

## 2014-04-17 NOTE — Care Management Utilization Note (Signed)
UR completed 

## 2014-04-17 NOTE — Progress Notes (Addendum)
[facility] PROGRESS NOTE  Matthew Butler JQZ:[id number] DOB: [birth year] DOA: 04/16/2014  PCP: PROVIDER NOT IN SYSTEM  Brief HPI: 68 year old with a history of coronary artery disease, presented with right upper quadrant abdominal pain. He had elevated LFTs and his ultrasound was suspicious for cholecystitis. He was admitted. General surgery was consulted.  Past medical history:  Past Medical History  Diagnosis Date  . Hodgkin disease     Consultants: Gen. surgery  Procedures: None yet  Antibiotics: Unasyn  Subjective: Patient continues to have upper abdominal pain on the right side. It's 4 out of 10 in intensity. Occasional belching. Denies any chest pain or shortness of breath. Had a negative cardiac stress test in July 2015.  Objective: Vital Signs  Filed Vitals:   04/16/14 1917 04/16/14 1947 04/16/14 2014 04/17/14 0657  BP: 114/56  116/64 113/59  Pulse:   92 75  Temp: 98.9 F (37.2 C)  98.7 F (37.1 C) 98.3 F (36.8 C)  TempSrc: Oral  Oral Oral  Resp: 20  20 18   Height:      Weight:   91.5 kg (201 lb 11.5 oz)   SpO2: 97% 96% 96% 94%    Intake/Output Summary (Last 24 hours) at 04/17/14 0847 Last data filed at 04/17/14 0500  Gross per 24 hour  Intake   1380 ml  Output    350 ml  Net   1030 ml   Filed Weights   04/16/14 1611 04/16/14 2014  Weight: 89.812 kg (198 lb) 91.5 kg (201 lb 11.5 oz)    General appearance: alert, cooperative, appears stated age and no distress Resp: clear to auscultation bilaterally Cardio: regular rate and rhythm, S1, S2 normal, no murmur, click, rub or gallop GI: soft, tender in RUQ; bowel sounds normal; no masses,  no organomegaly Extremities: extremities normal, atraumatic, no cyanosis or edema  Lab Results:  Basic Metabolic Panel:  Recent Labs Lab 04/16/14 1700 04/17/14 0601  NA 136 136  K 4.1 3.7  CL 100 101  CO2 30 29  GLUCOSE 105* 104*  BUN 20 17  CREATININE 1.40* 1.06  CALCIUM 8.8 8.4   Liver Function  Tests:  Recent Labs Lab 04/16/14 1700 04/17/14 0601  AST 130* 127*  ALT 69* 132*  ALKPHOS 147* 144*  BILITOT 1.4* 2.3*  PROT 7.6 6.2  ALBUMIN 3.7 2.9*    Recent Labs Lab 04/16/14 1700  LIPASE 26   CBC:  Recent Labs Lab 04/16/14 1700 04/17/14 0601  WBC 18.9* 10.1  NEUTROABS 16.0*  --   HGB 14.4 12.5*  HCT 42.6 37.2*  MCV 93.2 93.9  PLT 390 389    Studies/Results: Dg Chest 2 View  04/16/2014   CLINICAL DATA:  Acute right upper quadrant pain with nausea for 1 week  EXAM: CHEST  2 VIEW  COMPARISON:  None.  FINDINGS: Prior coronary bypass changes noted. Normal heart size and vascularity. Low lung volumes with slight elevation of right hemidiaphragm. No superimposed pneumonia, collapse or consolidation. Negative for edema, effusion or pneumothorax. Trachea midline.  IMPRESSION: Prior coronary bypass.  Low lung volumes without acute process.   Electronically Signed   By: Daryll Brod M.D.   On: 04/16/2014 19:44   US Abdomen Limited Ruq  04/16/2014   CLINICAL DATA:  Acute right upper quadrant pain with nausea and vomiting for 1 week.  EXAM: US ABDOMEN LIMITED - RIGHT UPPER QUADRANT  COMPARISON:  None.  FINDINGS: Gallbladder:  Hypoechoic intraluminal gallbladder sludge noted. This appears mobile. Difficult  to exclude small gallstones within the sludge. Gallbladder wall is mildly thickened measuring 6 mm. No pericholecystic fluid. No elicited sonographic Murphy sign. Appearance remains nonspecific for cholecystitis.  Common bile duct:  Diameter: 6.4 mm  Liver:  No focal lesion identified. Within normal limits in parenchymal echogenicity.  IMPRESSION: Heterogeneous intraluminal gallbladder sludge and suspect small gallstones. Associated mild gallbladder wall thickening. Despite this, no associated Murphys sign. Appearance remains equivocal for cholecystitis.  No biliary dilatation.   Electronically Signed   By: Daryll Brod M.D.   On: 04/16/2014 18:11    Medications:  Scheduled: .  ampicillin-sulbactam (UNASYN) IV  3 g Intravenous Q8H  . aspirin EC  81 mg Oral Daily  . finasteride  5 mg Oral Daily  . heparin  5,000 Units Subcutaneous 3 times per day  . metoprolol succinate  25 mg Oral Daily  . pantoprazole  40 mg Oral Daily  . senna  1 tablet Oral BID  . simvastatin  40 mg Oral QHS  . tamsulosin  0.4 mg Oral Daily   Continuous: . sodium chloride 100 mL/hr at 04/16/14 2100   BFX:OVANVBTYOMAYO (DILAUDID) injection, morphine injection, polyethylene glycol  Assessment/Plan:  Principal Problem:   Acute cholecystitis Active Problems:   BPH (benign prostatic hyperplasia)   AKI (acute kidney injury)   Benign essential HTN   HLD (hyperlipidemia)   GERD (gastroesophageal reflux disease)   Transaminitis    Right upper quadrant abdominal pain with transaminitis  This is most likely cholecystitis. Patient has hyperbilirubinemia. This morning. Discussed with Dr. Arnoldo Morale with general surgery. Proceed with MRCP for now. He will remain on clear liquids. Continue Unasyn. Pain control and antiemetics as needed. WBC is improved. Lipase was normal.  Mild AKI Resolved with IV fluids. Continue for now.  Essential HTN Blood pressure is stable. Continue current medications.  History of coronary artery disease A status post bypass surgery about 5 years ago. He had a negative cardiac stress test in July of last year. Does not have any chest pain or shortness of breath currently. Continue with his aspirin and his beta blocker. Appears to be stable from a cardiac perspective.  History of BPH Continue proscar and flomax  GERD Continue PPI  HLD Continue Zocor  DVT Prophylaxis: Heparin    Code Status: Full code  Family Communication: Discussed in detail with the patient  Disposition Plan: Not ready for discharge    LOS: 1 day   [facility] Pager [phone] 04/17/2014, 8:47 AM  If 7PM-7AM, please contact night-coverage at [email or web address],  password [facility]

## 2014-04-17 NOTE — Care Management Note (Addendum)
    Page 1 of 1   04/18/2014     1:34:17 PM CARE MANAGEMENT NOTE 04/18/2014  Patient:  Matthew Butler, Matthew Butler   Account Number:  [id number]  Date Initiated:  04/17/2014  Documentation initiated by:  Jolene Provost  Subjective/Objective Assessment:   Pt admitted with cholecystitis. Pt is from home and independent at baseline. Pt has no HH services, DME's or med needs prior to admission.     Action/Plan:   Pt plans to discharge home with self care. Pt has no CM needs at this time.   Anticipated DC Date:  04/20/2014   Anticipated DC Plan:  [place]  CM consult      Choice offered to / List presented to:             Status of service:  Completed, signed off Medicare Important Message given?  YES (If response is "NO", the following Medicare IM given date fields will be blank) Date Medicare IM given:  04/18/2014 Medicare IM given by:  Vladimir Creeks Date Additional Medicare IM given:   Additional Medicare IM given by:    Discharge Disposition:  HOME/SELF CARE  Per UR Regulation:    If discussed at Long Length of Stay Meetings, dates discussed:    Comments:  04/18/14 1115 Vladimir Creeks RN/CM Pt having Lap Chole today, possible open Chole, if necessary due to adhesions 04/17/2014 [place], RN, MSN, [facility]

## 2014-04-18 ENCOUNTER — Encounter (HOSPITAL_COMMUNITY)
Admission: EM | Disposition: A | Discharge status: Home / Self Care | Payer: Self-pay | Source: Home / Self Care | Attending: Internal Medicine

## 2014-04-18 ENCOUNTER — Inpatient Hospital Stay (HOSPITAL_COMMUNITY): Payer: Medicare Other

## 2014-04-18 ENCOUNTER — Inpatient Hospital Stay (HOSPITAL_COMMUNITY): Payer: Medicare Other | Admitting: Anesthesiology

## 2014-04-18 ENCOUNTER — Encounter (HOSPITAL_COMMUNITY): Payer: Self-pay | Admitting: *Deleted

## 2014-04-18 ENCOUNTER — Ambulatory Visit (HOSPITAL_COMMUNITY): Payer: Medicare Other

## 2014-04-18 HISTORY — PX: CHOLECYSTECTOMY: SHX55

## 2014-04-18 LAB — COMPREHENSIVE METABOLIC PANEL
ALT: 111 U/L — ABNORMAL HIGH (ref 0–53)
AST: 71 U/L — ABNORMAL HIGH (ref 0–37)
Albumin: 3.2 g/dL — ABNORMAL LOW (ref 3.5–5.2)
Alkaline Phosphatase: 140 U/L — ABNORMAL HIGH (ref 39–117)
Anion gap: 4 — ABNORMAL LOW (ref 5–15)
BUN: 8 mg/dL (ref 6–23)
CO2: 32 mmol/L (ref 19–32)
Calcium: 8.8 mg/dL (ref 8.4–10.5)
Chloride: 103 mEq/L (ref 96–112)
Creatinine, Ser: 0.91 mg/dL (ref 0.50–1.35)
GFR calc Af Amer: >90 mL/min (ref >90)
GFR calc non Af Amer: 85 mL/min — ABNORMAL LOW (ref >90)
Glucose, Bld: 96 mg/dL (ref 70–99)
Potassium: 4.1 mmol/L (ref 3.5–5.1)
Sodium: 139 mmol/L (ref 135–145)
Total Bilirubin: 0.8 mg/dL (ref 0.3–1.2)
Total Protein: 6.9 g/dL (ref 6.0–8.3)

## 2014-04-18 LAB — CBC
HCT: 40.4 % (ref 39.0–52.0)
Hemoglobin: 13.2 g/dL (ref 13.0–17.0)
MCH: 31.1 pg (ref 26.0–34.0)
MCHC: 32.7 g/dL (ref 30.0–36.0)
MCV: 95.1 fL (ref 78.0–100.0)
Platelets: 416 10*3/uL — ABNORMAL HIGH (ref 150–400)
RBC: 4.25 MIL/uL (ref 4.22–5.81)
RDW: 13.9 % (ref 11.5–15.5)
WBC: 6.7 10*3/uL (ref 4.0–10.5)

## 2014-04-18 LAB — SURGICAL PCR SCREEN
MRSA, PCR: NEGATIVE
Staphylococcus aureus: NEGATIVE

## 2014-04-18 SURGERY — LAPAROSCOPIC CHOLECYSTECTOMY
Anesthesia: General

## 2014-04-18 MED ORDER — LACTATED RINGERS IV SOLN
INTRAVENOUS | Status: DC
  Administered 2014-04-18 (×2): via INTRAVENOUS

## 2014-04-18 MED ORDER — BUPIVACAINE HCL (PF) 0.5 % IJ SOLN
INTRAMUSCULAR | Status: AC
  Filled 2014-04-18: qty 30

## 2014-04-18 MED ORDER — 0.9 % SODIUM CHLORIDE (POUR BTL) OPTIME
TOPICAL | Status: DC | PRN
  Administered 2014-04-18: 3000 mL
  Administered 2014-04-18: 1000 mL

## 2014-04-18 MED ORDER — PROPOFOL 10 MG/ML IV BOLUS
INTRAVENOUS | Status: AC
  Filled 2014-04-18: qty 20

## 2014-04-18 MED ORDER — HYDROMORPHONE HCL 1 MG/ML IJ SOLN
1.0000 mg | INTRAMUSCULAR | Status: DC | PRN
  Administered 2014-04-19 (×3): 1 mg via INTRAVENOUS
  Filled 2014-04-18 (×3): qty 1

## 2014-04-18 MED ORDER — POVIDONE-IODINE 10 % EX OINT
TOPICAL_OINTMENT | CUTANEOUS | Status: AC
  Filled 2014-04-18: qty 1

## 2014-04-18 MED ORDER — ROCURONIUM BROMIDE 100 MG/10ML IV SOLN
INTRAVENOUS | Status: DC | PRN
  Administered 2014-04-18: 5 mg via INTRAVENOUS
  Administered 2014-04-18: 25 mg via INTRAVENOUS

## 2014-04-18 MED ORDER — ROCURONIUM BROMIDE 50 MG/5ML IV SOLN
INTRAVENOUS | Status: AC
  Filled 2014-04-18: qty 1

## 2014-04-18 MED ORDER — NEOSTIGMINE METHYLSULFATE 10 MG/10ML IV SOLN
INTRAVENOUS | Status: DC | PRN
  Administered 2014-04-18 (×2): 1 mg via INTRAVENOUS

## 2014-04-18 MED ORDER — PROPOFOL 10 MG/ML IV BOLUS
INTRAVENOUS | Status: DC | PRN
  Administered 2014-04-18: 140 mg via INTRAVENOUS
  Administered 2014-04-18: 30 mg via INTRAVENOUS

## 2014-04-18 MED ORDER — KETOROLAC TROMETHAMINE 30 MG/ML IJ SOLN
INTRAMUSCULAR | Status: AC
  Filled 2014-04-18: qty 1

## 2014-04-18 MED ORDER — ONDANSETRON HCL 4 MG/2ML IJ SOLN
4.0000 mg | Freq: Once | INTRAMUSCULAR | Status: AC
  Administered 2014-04-18: 4 mg via INTRAVENOUS

## 2014-04-18 MED ORDER — ALUM & MAG HYDROXIDE-SIMETH 200-200-20 MG/5ML PO SUSP
30.0000 mL | Freq: Once | ORAL | Status: AC
  Administered 2014-04-18: 30 mL via ORAL
  Filled 2014-04-18: qty 30

## 2014-04-18 MED ORDER — FENTANYL CITRATE 0.05 MG/ML IJ SOLN
25.0000 ug | INTRAMUSCULAR | Status: DC | PRN
  Administered 2014-04-18 (×4): 50 ug via INTRAVENOUS
  Filled 2014-04-18: qty 2

## 2014-04-18 MED ORDER — GLYCOPYRROLATE 0.2 MG/ML IJ SOLN
INTRAMUSCULAR | Status: DC | PRN
  Administered 2014-04-18: 0.4 mg via INTRAVENOUS

## 2014-04-18 MED ORDER — POVIDONE-IODINE 10 % OINT PACKET
TOPICAL_OINTMENT | CUTANEOUS | Status: DC | PRN
  Administered 2014-04-18: 1 via TOPICAL

## 2014-04-18 MED ORDER — SUCCINYLCHOLINE CHLORIDE 20 MG/ML IJ SOLN
INTRAMUSCULAR | Status: AC
  Filled 2014-04-18: qty 1

## 2014-04-18 MED ORDER — BUPIVACAINE HCL (PF) 0.5 % IJ SOLN
INTRAMUSCULAR | Status: DC | PRN
  Administered 2014-04-18: 10 mL

## 2014-04-18 MED ORDER — ONDANSETRON HCL 4 MG PO TABS
4.0000 mg | ORAL_TABLET | Freq: Four times a day (QID) | ORAL | Status: DC | PRN
  Administered 2014-04-18: 4 mg via ORAL
  Filled 2014-04-18: qty 1

## 2014-04-18 MED ORDER — EPHEDRINE SULFATE 50 MG/ML IJ SOLN
INTRAMUSCULAR | Status: AC
  Filled 2014-04-18: qty 1

## 2014-04-18 MED ORDER — FENTANYL CITRATE 0.05 MG/ML IJ SOLN
INTRAMUSCULAR | Status: AC
  Filled 2014-04-18: qty 2

## 2014-04-18 MED ORDER — MIDAZOLAM HCL 2 MG/2ML IJ SOLN
INTRAMUSCULAR | Status: AC
  Filled 2014-04-18: qty 2

## 2014-04-18 MED ORDER — FENTANYL CITRATE 0.05 MG/ML IJ SOLN
INTRAMUSCULAR | Status: DC | PRN
  Administered 2014-04-18 (×2): 50 ug via INTRAVENOUS
  Administered 2014-04-18: 25 ug via INTRAVENOUS
  Administered 2014-04-18 (×2): 50 ug via INTRAVENOUS
  Administered 2014-04-18: 25 ug via INTRAVENOUS
  Administered 2014-04-18: 100 ug via INTRAVENOUS

## 2014-04-18 MED ORDER — ONDANSETRON HCL 4 MG/2ML IJ SOLN
INTRAMUSCULAR | Status: AC
  Filled 2014-04-18: qty 2

## 2014-04-18 MED ORDER — KETOROLAC TROMETHAMINE 30 MG/ML IJ SOLN
30.0000 mg | Freq: Once | INTRAMUSCULAR | Status: AC
  Administered 2014-04-18: 30 mg via INTRAVENOUS

## 2014-04-18 MED ORDER — EPHEDRINE SULFATE 50 MG/ML IJ SOLN
INTRAMUSCULAR | Status: DC | PRN
Start: 2014-04-18 — End: 2014-04-18
  Administered 2014-04-18 (×3): 5 mg via INTRAVENOUS

## 2014-04-18 MED ORDER — LIDOCAINE HCL (CARDIAC) 10 MG/ML IV SOLN
INTRAVENOUS | Status: DC | PRN
  Administered 2014-04-18: 2 mg via INTRAVENOUS

## 2014-04-18 MED ORDER — OXYCODONE-ACETAMINOPHEN 5-325 MG PO TABS
1.0000 | ORAL_TABLET | ORAL | Status: DC | PRN
  Administered 2014-04-18 – 2014-04-19 (×3): 2 via ORAL
  Filled 2014-04-18 (×3): qty 2

## 2014-04-18 MED ORDER — MIDAZOLAM HCL 2 MG/2ML IJ SOLN
1.0000 mg | INTRAMUSCULAR | Status: DC | PRN
  Administered 2014-04-18: 2 mg via INTRAVENOUS

## 2014-04-18 MED ORDER — LACTATED RINGERS IV SOLN
INTRAVENOUS | Status: DC
  Administered 2014-04-18 – 2014-04-19 (×2): via INTRAVENOUS

## 2014-04-18 MED ORDER — ONDANSETRON HCL 4 MG/2ML IJ SOLN
4.0000 mg | Freq: Once | INTRAMUSCULAR | Status: AC | PRN
  Administered 2014-04-18: 4 mg via INTRAVENOUS
  Filled 2014-04-18: qty 2

## 2014-04-18 MED ORDER — CHLORHEXIDINE GLUCONATE 4 % EX LIQD
1.0000 "application " | Freq: Once | CUTANEOUS | Status: AC
  Administered 2014-04-18: 1 via TOPICAL
  Filled 2014-04-18: qty 15

## 2014-04-18 MED ORDER — FENTANYL CITRATE 0.05 MG/ML IJ SOLN
INTRAMUSCULAR | Status: AC
  Filled 2014-04-18: qty 5

## 2014-04-18 MED ORDER — ENOXAPARIN SODIUM 40 MG/0.4ML ~~LOC~~ SOLN
40.0000 mg | SUBCUTANEOUS | Status: DC
  Administered 2014-04-19 – 2014-04-20 (×2): 40 mg via SUBCUTANEOUS
  Filled 2014-04-18 (×2): qty 0.4

## 2014-04-18 MED ORDER — LIDOCAINE HCL (PF) 1 % IJ SOLN
INTRAMUSCULAR | Status: AC
  Filled 2014-04-18: qty 5

## 2014-04-18 MED ORDER — HEMOSTATIC AGENTS (NO CHARGE) OPTIME
TOPICAL | Status: DC | PRN
  Administered 2014-04-18: 2 via TOPICAL

## 2014-04-18 MED ORDER — SUCCINYLCHOLINE CHLORIDE 20 MG/ML IJ SOLN
INTRAMUSCULAR | Status: DC | PRN
  Administered 2014-04-18: 120 mg via INTRAVENOUS

## 2014-04-18 MED ORDER — ONDANSETRON HCL 4 MG/2ML IJ SOLN: 4.0000 mg | Freq: Four times a day (QID) | INTRAMUSCULAR | Status: DC | PRN

## 2014-04-18 MED ORDER — SODIUM CHLORIDE 0.9 % IJ SOLN
INTRAMUSCULAR | Status: AC
  Filled 2014-04-18: qty 10

## 2014-04-18 MED ORDER — GLYCOPYRROLATE 0.2 MG/ML IJ SOLN
INTRAMUSCULAR | Status: AC
  Filled 2014-04-18: qty 3

## 2014-04-18 SURGICAL SUPPLY — 55 items
APPLIER CLIP LAPSCP 10X32 DD (CLIP) ×3 IMPLANT
BAG HAMPER (MISCELLANEOUS) ×3 IMPLANT
CATH CHOLANGIOGRAM 4.5FR (CATHETERS) IMPLANT
CHLORAPREP W/TINT 26ML (MISCELLANEOUS) ×3 IMPLANT
CLOTH BEACON ORANGE TIMEOUT ST (SAFETY) ×3 IMPLANT
COVER LIGHT HANDLE STERIS (MISCELLANEOUS) ×6 IMPLANT
COVER MAYO STAND XLG (DRAPE) ×3 IMPLANT
CUTTER FLEX LINEAR 45M (STAPLE) ×3 IMPLANT
DECANTER SPIKE VIAL GLASS SM (MISCELLANEOUS) ×3 IMPLANT
DISSECTOR BLUNT TIP ENDO 5MM (MISCELLANEOUS) IMPLANT
DRAPE C-ARM FOLDED MOBILE STRL (DRAPES) ×3 IMPLANT
ELECT REM PT RETURN 9FT ADLT (ELECTROSURGICAL) ×3
ELECTRODE REM PT RTRN 9FT ADLT (ELECTROSURGICAL) ×1 IMPLANT
EVACUATOR DRAINAGE 10X20 100CC (DRAIN) ×1 IMPLANT
EVACUATOR SILICONE 100CC (DRAIN) ×2
FILTER SMOKE EVAC LAPAROSHD (FILTER) ×3 IMPLANT
FORMALIN 10 PREFIL 120ML (MISCELLANEOUS) ×3 IMPLANT
GLOVE BIOGEL PI IND STRL 7.0 (GLOVE) ×2 IMPLANT
GLOVE BIOGEL PI IND STRL 7.5 (GLOVE) ×1 IMPLANT
GLOVE BIOGEL PI INDICATOR 7.0 (GLOVE) ×4
GLOVE BIOGEL PI INDICATOR 7.5 (GLOVE) ×2
GLOVE ECLIPSE 6.5 STRL STRAW (GLOVE) ×6 IMPLANT
GLOVE SURG SS PI 7.5 STRL IVOR (GLOVE) ×6 IMPLANT
GOWN STRL REUS W/ TWL XL LVL3 (GOWN DISPOSABLE) ×1 IMPLANT
GOWN STRL REUS W/TWL LRG LVL3 (GOWN DISPOSABLE) ×9 IMPLANT
GOWN STRL REUS W/TWL XL LVL3 (GOWN DISPOSABLE) ×2
HEMOSTAT SNOW SURGICEL 2X4 (HEMOSTASIS) ×6 IMPLANT
INST SET LAPROSCOPIC AP (KITS) ×3 IMPLANT
IV NS IRRIG 3000ML ARTHROMATIC (IV SOLUTION) ×3 IMPLANT
KIT ROOM TURNOVER APOR (KITS) ×3 IMPLANT
LIGASURE LAP ATLAS 10MM 37CM (INSTRUMENTS) ×3 IMPLANT
MANIFOLD NEPTUNE II (INSTRUMENTS) ×3 IMPLANT
NEEDLE INSUFFLATION 120MM (ENDOMECHANICALS) ×3 IMPLANT
NS IRRIG 1000ML POUR BTL (IV SOLUTION) ×3 IMPLANT
PACK LAP CHOLE LZT030E (CUSTOM PROCEDURE TRAY) ×3 IMPLANT
PAD ARMBOARD 7.5X6 YLW CONV (MISCELLANEOUS) ×3 IMPLANT
POUCH SPECIMEN RETRIEVAL 10MM (ENDOMECHANICALS) ×3 IMPLANT
RELOAD STAPLE TA45 3.5 REG BLU (ENDOMECHANICALS) ×3 IMPLANT
SET BASIN LINEN APH (SET/KITS/TRAYS/PACK) ×3 IMPLANT
SET TUBE IRRIG SUCTION NO TIP (IRRIGATION / IRRIGATOR) ×3 IMPLANT
SLEEVE ENDOPATH XCEL 5M (ENDOMECHANICALS) ×3 IMPLANT
SPONGE GAUZE 2X2 8PLY STER LF (GAUZE/BANDAGES/DRESSINGS) ×4
SPONGE GAUZE 2X2 8PLY STRL LF (GAUZE/BANDAGES/DRESSINGS) ×8 IMPLANT
STAPLER VISISTAT (STAPLE) ×3 IMPLANT
SUT ETHILON 3 0 FSL (SUTURE) ×6 IMPLANT
SUT VICRYL 0 UR6 27IN ABS (SUTURE) ×3 IMPLANT
SYR 20CC LL (SYRINGE) ×3 IMPLANT
SYR 30ML LL (SYRINGE) ×3 IMPLANT
TAPE CLOTH SURG 4X10 WHT LF (GAUZE/BANDAGES/DRESSINGS) ×3 IMPLANT
TROCAR ENDO BLADELESS 11MM (ENDOMECHANICALS) ×3 IMPLANT
TROCAR XCEL NON-BLD 5MMX100MML (ENDOMECHANICALS) ×3 IMPLANT
TROCAR XCEL UNIV SLVE 11M 100M (ENDOMECHANICALS) ×3 IMPLANT
TUBING INSUFFLATION (TUBING) ×3 IMPLANT
WARMER LAPAROSCOPE (MISCELLANEOUS) ×3 IMPLANT
YANKAUER SUCT 12FT TUBE ARGYLE (SUCTIONS) ×3 IMPLANT

## 2014-04-18 NOTE — Anesthesia Procedure Notes (Addendum)
Procedure Name: Intubation Performed by: Vista Deck Pre-anesthesia Checklist: Patient identified, Patient being monitored, Timeout performed, Emergency Drugs available and Suction available Patient Re-evaluated:Patient Re-evaluated prior to inductionOxygen Delivery Method: Circle System Utilized Preoxygenation: Pre-oxygenation with 100% oxygen Intubation Type: IV induction, Rapid sequence and Cricoid Pressure applied Ventilation: Mask ventilation without difficulty and Oral airway inserted - appropriate to patient size Laryngoscope Size: Glidescope and 3 Grade View: Grade I Tube type: Oral Tube size: 7.0 mm Number of attempts: 2 Airway Equipment and Method: Stylet and Video-laryngoscopy Placement Confirmation: ETT inserted through vocal cords under direct vision,  positive ETCO2 and breath sounds checked- equal and bilateral Secured at: 22 cm Tube secured with: Tape Dental Injury: Teeth and Oropharynx as per pre-operative assessment  Comments: Grade 3 view with Mil 2; unable to easily pass ETT through cords. Glide scope obtained and Grade 1 view. 7 ETT inserted with minimal resistance.

## 2014-04-18 NOTE — Progress Notes (Signed)
[facility] PROGRESS NOTE  Myrle Dues IDP:[id number] DOB: 04-19-1945 DOA: 04/16/2014  PCP: Dr. Alroy Dust in [place]  Brief HPI: 69 year old with a history of coronary artery disease, presented with right upper quadrant abdominal pain. He had elevated LFTs and his ultrasound was suspicious for cholecystitis. He was admitted. General surgery was consulted. Plan is for cholecystectomy 1/15.  Past medical history:  Past Medical History  Diagnosis Date  . Hodgkin disease     Consultants: Gen. surgery  Procedures: Cholecystectomy planned for 1/15  Antibiotics: Unasyn  Subjective: Patient continues to have some upper abdominal pain on the right side. No vomiting.   Objective: Vital Signs  Filed Vitals:   04/17/14 1102 04/17/14 1320 04/17/14 2132 04/18/14 0620  BP: 119/61 124/65 136/72 128/66  Pulse: 63 65 72 65  Temp:  98.8 F (37.1 C) 98.2 F (36.8 C) 98.3 F (36.8 C)  TempSrc:  Oral Oral Oral  Resp:  20 20 20   Height:      Weight:      SpO2:  95% 97% 95%    Intake/Output Summary (Last 24 hours) at 04/18/14 0800 Last data filed at 04/18/14 0600  Gross per 24 hour  Intake   3400 ml  Output      0 ml  Net   3400 ml   Filed Weights   04/16/14 1611 04/16/14 2014  Weight: 89.812 kg (198 lb) 91.5 kg (201 lb 11.5 oz)    General appearance: alert, cooperative, appears stated age and no distress Resp: clear to auscultation bilaterally Cardio: regular rate and rhythm, S1, S2 normal, no murmur, click, rub or gallop GI: soft, tender in RUQ; bowel sounds normal; no masses,  no organomegaly Extremities: extremities normal, atraumatic, no cyanosis or edema  Lab Results:  Basic Metabolic Panel:  Recent Labs Lab 04/16/14 1700 04/17/14 0601 04/18/14 0602  NA 136 136 139  K 4.1 3.7 4.1  CL 100 101 103  CO2 30 29 32  GLUCOSE 105* 104* 96  BUN 20 17 8   CREATININE 1.40* 1.06 0.91  CALCIUM 8.8 8.4 8.8   Liver Function Tests:  Recent Labs Lab 04/16/14 1700  04/17/14 0601 04/18/14 0602  AST 130* 127* 71*  ALT 69* 132* 111*  ALKPHOS 147* 144* 140*  BILITOT 1.4* 2.3* 0.8  PROT 7.6 6.2 6.9  ALBUMIN 3.7 2.9* 3.2*    Recent Labs Lab 04/16/14 1700  LIPASE 26   CBC:  Recent Labs Lab 04/16/14 1700 04/17/14 0601 04/18/14 0602  WBC 18.9* 10.1 6.7  NEUTROABS 16.0*  --   --   HGB 14.4 12.5* 13.2  HCT 42.6 37.2* 40.4  MCV 93.2 93.9 95.1  PLT 390 389 416*    Studies/Results: Dg Chest 2 View  04/16/2014   CLINICAL DATA:  Acute right upper quadrant pain with nausea for 1 week  EXAM: CHEST  2 VIEW  COMPARISON:  None.  FINDINGS: Prior coronary bypass changes noted. Normal heart size and vascularity. Low lung volumes with slight elevation of right hemidiaphragm. No superimposed pneumonia, collapse or consolidation. Negative for edema, effusion or pneumothorax. Trachea midline.  IMPRESSION: Prior coronary bypass.  Low lung volumes without acute process.   Electronically Signed   By: Daryll Brod M.D.   On: 04/16/2014 19:44   Mr 3d Recon At Scanner  04/17/2014   CLINICAL DATA:  Abdominal pain and elevated liver function studies. Abnormal ultrasound.  EXAM: MRI ABDOMEN WITHOUT AND WITH CONTRAST (INCLUDING MRCP)  TECHNIQUE: Multiplanar multisequence MR imaging of the  abdomen was performed both before and after the administration of intravenous contrast. Heavily T2-weighted images of the biliary and pancreatic ducts were obtained, and three-dimensional MRCP images were rendered by post processing.  CONTRAST:  53mL MULTIHANCE GADOBENATE DIMEGLUMINE 529 MG/ML IV SOLN  COMPARISON:  Ultrasound 04/16/2014  FINDINGS: No focal hepatic lesions or intrahepatic biliary dilatation. The gallbladder has a slightly thickened wall and there are small layering gallstones and sludge. The common bile duct has a normal caliber and normal course. The pancreatic duct is normal. No common bile duct stones are identified.  The pancreas is unremarkable. No pancreatic inflammation  an or mass. There is a small duodenum diverticulum adjacent to the pancreatic head.  In the left upper quadrant there are soft tissue densities which probably reflect splenic tissue. I suspect the patient had previous splenic trauma or splenectomy with regenerated splenic tissue in the left upper quadrant. Recommend clinical correlation.  The adrenal glands and kidneys are unremarkable. There are small bilateral renal cysts. No worrisome renal lesions or hydronephrosis.  The stomach, duodenum, small bowel and colon are grossly normal. No mesenteric or retroperitoneal mass or adenopathy. Small scattered lymph nodes are noted. The aorta and branch vessels are patent. The major venous structures are patent.  No significant bony findings. Lumbar scoliosis and degenerative lumbar spondylosis are noted.  IMPRESSION: Cholelithiasis, gallbladder sludge and mild wall thickening suggesting acute cholecystitis.  No intra or extrahepatic biliary dilatation. No common bile duct stones.  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   Electronically Signed   By: Kalman Jewels M.D.   On: 04/17/2014 11:42   [name] Cm/mrcp  04/17/2014   CLINICAL DATA:  Abdominal pain and elevated liver function studies. Abnormal ultrasound.  EXAM: MRI ABDOMEN WITHOUT AND WITH CONTRAST (INCLUDING MRCP)  TECHNIQUE: Multiplanar multisequence MR imaging of the abdomen was performed both before and after the administration of intravenous contrast. Heavily T2-weighted images of the biliary and pancreatic ducts were obtained, and three-dimensional MRCP images were rendered by post processing.  CONTRAST:  34mL MULTIHANCE GADOBENATE DIMEGLUMINE 529 MG/ML IV SOLN  COMPARISON:  Ultrasound 04/16/2014  FINDINGS: No focal hepatic lesions or intrahepatic biliary dilatation. The gallbladder has a slightly thickened wall and there are small layering gallstones and sludge. The common bile duct has a normal  caliber and normal course. The pancreatic duct is normal. No common bile duct stones are identified.  The pancreas is unremarkable. No pancreatic inflammation an or mass. There is a small duodenum diverticulum adjacent to the pancreatic head.  In the left upper quadrant there are soft tissue densities which probably reflect splenic tissue. I suspect the patient had previous splenic trauma or splenectomy with regenerated splenic tissue in the left upper quadrant. Recommend clinical correlation.  The adrenal glands and kidneys are unremarkable. There are small bilateral renal cysts. No worrisome renal lesions or hydronephrosis.  The stomach, duodenum, small bowel and colon are grossly normal. No mesenteric or retroperitoneal mass or adenopathy. Small scattered lymph nodes are noted. The aorta and branch vessels are patent. The major venous structures are patent.  No significant bony findings. Lumbar scoliosis and degenerative lumbar spondylosis are noted.  IMPRESSION: Cholelithiasis, gallbladder sludge and mild wall thickening suggesting acute cholecystitis.  No intra or extrahepatic biliary dilatation. No common bile duct stones.  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   Electronically Signed   By: Kalman Jewels M.D.   On:  04/17/2014 11:42   US Abdomen Limited Ruq  04/16/2014   CLINICAL DATA:  Acute right upper quadrant pain with nausea and vomiting for 1 week.  EXAM: US ABDOMEN LIMITED - RIGHT UPPER QUADRANT  COMPARISON:  None.  FINDINGS: Gallbladder:  Hypoechoic intraluminal gallbladder sludge noted. This appears mobile. Difficult to exclude small gallstones within the sludge. Gallbladder wall is mildly thickened measuring 6 mm. No pericholecystic fluid. No elicited sonographic Murphy sign. Appearance remains nonspecific for cholecystitis.  Common bile duct:  Diameter: 6.4 mm  Liver:  No focal lesion identified. Within normal limits in parenchymal  echogenicity.  IMPRESSION: Heterogeneous intraluminal gallbladder sludge and suspect small gallstones. Associated mild gallbladder wall thickening. Despite this, no associated Murphys sign. Appearance remains equivocal for cholecystitis.  No biliary dilatation.   Electronically Signed   By: Daryll Brod M.D.   On: 04/16/2014 18:11    Medications:  Scheduled: . ampicillin-sulbactam (UNASYN) IV  3 g Intravenous Q8H  . aspirin EC  81 mg Oral Daily  . finasteride  5 mg Oral Daily  . heparin  5,000 Units Subcutaneous 3 times per day  . metoprolol succinate  25 mg Oral Daily  . pantoprazole  40 mg Oral Daily  . senna  1 tablet Oral BID  . simvastatin  40 mg Oral QHS  . tamsulosin  0.4 mg Oral Daily   Continuous: . sodium chloride 100 mL/hr at 04/17/14 2110   RCB:ULAGTXMIWOEHO (DILAUDID) injection, polyethylene glycol  Assessment/Plan:  Principal Problem:   Acute cholecystitis Active Problems:   BPH (benign prostatic hyperplasia)   AKI (acute kidney injury)   Benign essential HTN   HLD (hyperlipidemia)   GERD (gastroesophageal reflux disease)   Transaminitis    Right upper quadrant abdominal pain with transaminitis/Acute Cholecystitis  MRCP report reviewed. No CBD stones. Plan is for cholecystectomy today. Continue Unasyn. Pain control and antiemetics as needed. WBC is improved. Lipase was normal. LFT improving.  Mild AKI Resolved with IV fluids. Continue for now at lower rate.  Essential HTN Blood pressure is stable. Continue current medications.  History of coronary artery disease He is status post bypass surgery about 5 years ago. He had a negative cardiac stress test in July of last year. Does not have any chest pain or shortness of breath currently. Continue with his aspirin and his beta blocker. Appears to be stable from a cardiac perspective.  History of BPH Continue proscar and flomax  GERD Continue PPI  HLD Hold Zocor.  DVT Prophylaxis: Heparin    Code  Status: Full code  Family Communication: Discussed in detail with the patient  Disposition Plan: Not ready for discharge    LOS: 2 days   [facility] Pager [phone] 04/18/2014, 8:00 AM  If 7PM-7AM, please contact night-coverage at [email or web address], password [facility]

## 2014-04-18 NOTE — Anesthesia Postprocedure Evaluation (Signed)
  Anesthesia Post-op Note  Patient: Matthew Butler  Procedure(s) Performed: Procedure(s): LAPAROSCOPIC CHOLECYSTECTOMY (N/A)  Patient Location: PACU  Anesthesia Type:General  Level of Consciousness: awake, alert , oriented and patient cooperative  Airway and Oxygen Therapy: Patient Spontanous Breathing and Patient connected to nasal cannula oxygen  Post-op Pain: 3 /10, mild  Post-op Assessment: Post-op Vital signs reviewed, Patient's Cardiovascular Status Stable, Respiratory Function Stable, Patent Airway, Pain level controlled and No headache  Post-op Vital Signs: Reviewed and stable  Last Vitals:  Filed Vitals:   04/18/14 1304  BP:   Pulse:   Temp: 36.3 C  Resp:     Complications: No apparent anesthesia complications

## 2014-04-18 NOTE — Op Note (Signed)
Patient:  Matthew Butler  DOB:  04/24/1945  MRN:  [id number]   Preop Diagnosis:  Cholecystitis, cholelithiasis  Postop Diagnosis:  Same, empyema of gallbladder  Procedure:  Laparoscopic cholecystectomy  Surgeon:  Aviva Signs, M.D.  Anes:  General endotracheal  Indications:  Patient is a 69 year old white male who presented with cholecystitis and cholelithiasis. He did undergo an MRCP for elevated bilirubin levels which was unremarkable. His bilirubin levels have returned to normal. He now presents for laparoscopic cholecystectomy, possible cholangiograms. The risks and benefits of the procedure including bleeding, infection, hepatobiliary injury, and the possibility of an open procedure were fully explained to the patient, who gave informed consent.  Procedure note:  The patient was placed the supine position. After induction of general endotracheal anesthesia, the abdomen was prepped and draped using usual sterile technique with ChloraPrep. Surgical site confirmation was performed.  An infraumbilical incision was made down to the fascia. A Veress needle was introduced into the abdominal cavity and confirmation of placement was done using the saline drop test. The abdomen was then insufflated to 16 mmHg pressure. An 11 mm trocar was introduced into the abdominal cavity under direct visualization without difficulty. The patient was placed in reverse Trendelenburg position and additional 11 mm trocar was placed the epigastric region. 5 mm trochars were placed the right upper quadrant and right flank regions. There was a significant amount of adhesive disease and omentum around the gallbladder. This was freed away using the LigaSure. The gallbladder had a thickened gallbladder wall with tension. The gallbladder was decompressed and or to facilitate dynamic exposure. Using a dome down approach, the dissection was taken down to the infundibulum. The gallbladder was freed away from the liver using Bovie  electrocautery. The cystic duct was identified. Its junction to the infundibulum was fully identified. Given the significant inflammation, I could not perform a cholangiogram. A standard Endo GIA was placed across the cystic duct and fired. The cystic artery was ligated and divided using endoclips. The gallbladder was then delivered through the epigastric trocar site using an Endo Catch bag. The gallbladder fossa and right upper quadrant were copiously irrigated with with normal saline. Any bleeding was controlled using Bovie electrocautery. Surgicel is placed the gallbladder fossa. A #10 flat Jackson-Pratt drain was placed into the gallbladder fossa and brought through a 5 mm trocar site. It was secured at the skin level using 3-0 nylon suture. All fluid and air were then evacuated from the abdominal cavity prior to removal of the trochars.  All wounds were irrigated with normal saline. All wounds were injected with 0.5% Sensorcaine. The epigastric fascia as well as infraumbilical fascia was reapproximated using 0 Vicryl interrupted sutures. All skin incisions were closed using staples. Betadine ointment dry sterile dressings were applied.  All tape and needle counts were correct at the end of the procedure. Patient was extubated in the operating room and transferred to PACU in stable condition.  Complications:  none  EBL:  Less than 50 mL  Specimen:  Gallbladder  Drains: JP drain to subhepatic space

## 2014-04-18 NOTE — Anesthesia Preprocedure Evaluation (Signed)
Anesthesia Evaluation  Patient identified by MRN, date of birth, ID band Patient awake    Reviewed: Allergy & Precautions, NPO status , Patient's Chart, lab work & pertinent test results  Airway Mallampati: II  TM Distance: >3 FB     Dental  (+) Teeth Intact   Pulmonary former smoker,  breath sounds clear to auscultation        Cardiovascular hypertension, Pt. on medications + CAD and + CABG Rhythm:Regular Rate:Normal     Neuro/Psych    GI/Hepatic GERD-  Medicated,  Endo/Other    Renal/GU Renal disease     Musculoskeletal   Abdominal   Peds  Hematology   Anesthesia Other Findings   Reproductive/Obstetrics                             Anesthesia Physical Anesthesia Plan  ASA: III  Anesthesia Plan: General   Post-op Pain Management:    Induction: Intravenous, Rapid sequence and Cricoid pressure planned  Airway Management Planned: Oral ETT  Additional Equipment:   Intra-op Plan:   Post-operative Plan: Extubation in OR  Informed Consent: I have reviewed the patients History and Physical, chart, labs and discussed the procedure including the risks, benefits and alternatives for the proposed anesthesia with the patient or authorized representative who has indicated his/her understanding and acceptance.     Plan Discussed with:   Anesthesia Plan Comments:         Anesthesia Quick Evaluation

## 2014-04-18 NOTE — Transfer of Care (Signed)
Immediate Anesthesia Transfer of Care Note  Patient: Matthew Butler  Procedure(s) Performed: Procedure(s): LAPAROSCOPIC CHOLECYSTECTOMY (N/A)  Patient Location: PACU  Anesthesia Type:General  Level of Consciousness: awake and patient cooperative  Airway & Oxygen Therapy: Patient Spontanous Breathing and Patient connected to face mask oxygen  Post-op Assessment: Report given to PACU RN, Post -op Vital signs reviewed and stable and Patient moving all extremities  Post vital signs: Reviewed and stable  Complications: No apparent anesthesia complications

## 2014-04-19 LAB — CBC
HCT: 33.5 % — ABNORMAL LOW (ref 39.0–52.0)
Hemoglobin: 11.2 g/dL — ABNORMAL LOW (ref 13.0–17.0)
MCH: 31.5 pg (ref 26.0–34.0)
MCHC: 33.4 g/dL (ref 30.0–36.0)
MCV: 94.4 fL (ref 78.0–100.0)
Platelets: 375 10*3/uL (ref 150–400)
RBC: 3.55 MIL/uL — ABNORMAL LOW (ref 4.22–5.81)
RDW: 14 % (ref 11.5–15.5)
WBC: 12.9 10*3/uL — ABNORMAL HIGH (ref 4.0–10.5)

## 2014-04-19 LAB — COMPREHENSIVE METABOLIC PANEL
ALT: 76 U/L — ABNORMAL HIGH (ref 0–53)
AST: 47 U/L — ABNORMAL HIGH (ref 0–37)
Albumin: 2.5 g/dL — ABNORMAL LOW (ref 3.5–5.2)
Alkaline Phosphatase: 99 U/L (ref 39–117)
Anion gap: 6 (ref 5–15)
BUN: 9 mg/dL (ref 6–23)
CO2: 30 mmol/L (ref 19–32)
Calcium: 8.1 mg/dL — ABNORMAL LOW (ref 8.4–10.5)
Chloride: 99 mEq/L (ref 96–112)
Creatinine, Ser: 1.02 mg/dL (ref 0.50–1.35)
GFR calc Af Amer: 85 mL/min — ABNORMAL LOW (ref >90)
GFR calc non Af Amer: 73 mL/min — ABNORMAL LOW (ref >90)
Glucose, Bld: 109 mg/dL — ABNORMAL HIGH (ref 70–99)
Potassium: 3.8 mmol/L (ref 3.5–5.1)
Sodium: 135 mmol/L (ref 135–145)
Total Bilirubin: 0.7 mg/dL (ref 0.3–1.2)
Total Protein: 5.8 g/dL — ABNORMAL LOW (ref 6.0–8.3)

## 2014-04-19 LAB — MAGNESIUM: Magnesium: 1.8 mg/dL (ref 1.5–2.5)

## 2014-04-19 LAB — PHOSPHORUS: Phosphorus: 4 mg/dL (ref 2.3–4.6)

## 2014-04-19 MED ORDER — MAGNESIUM HYDROXIDE 400 MG/5ML PO SUSP
30.0000 mL | Freq: Four times a day (QID) | ORAL | Status: DC | PRN
  Administered 2014-04-19: 30 mL via ORAL
  Filled 2014-04-19: qty 30

## 2014-04-19 MED ORDER — SODIUM CHLORIDE 0.9 % IV BOLUS (SEPSIS)
250.0000 mL | Freq: Once | INTRAVENOUS | Status: AC
  Administered 2014-04-19: 250 mL via INTRAVENOUS

## 2014-04-19 NOTE — Progress Notes (Signed)
[facility] PROGRESS NOTE  Matthew Butler NMM:[id number] DOB: July 09, 1945 DOA: 04/16/2014  PCP: Dr. Alroy Dust in [name]  Brief HPI: 69 year old with a history of coronary artery disease, presented with right upper quadrant abdominal pain. He had elevated LFTs and his ultrasound was suspicious for cholecystitis. He was admitted. General surgery was consulted. He underwent laparoscopic cholecystectomy 1/15.  Past medical history:  Past Medical History  Diagnosis Date  . Hodgkin disease   . Coronary artery disease 2011    Consultants: Gen. surgery  Procedures: Cholecystectomy 1/15  Antibiotics: Unasyn  Subjective: Patient tolerated his surgery well. Continues to have some pain in the right upper abdomen. Denies any chest pain or shortness of breath. No nausea, vomiting. Tolerated solid food last night. Ambulate in the hallway. Not passing any gas yet.   Objective: Vital Signs  Filed Vitals:   04/18/14 1405 04/18/14 1544 04/18/14 2039 04/19/14 0649  BP: 126/68 113/61 108/59 100/43  Pulse: 78 86 84 80  Temp:   98.6 F (37 C) 98.4 F (36.9 C)  TempSrc:   Oral Oral  Resp: 20 20 20 18   Height:      Weight:      SpO2: 97% 100% 91% 100%    Intake/Output Summary (Last 24 hours) at 04/19/14 0817 Last data filed at 04/19/14 0653  Gross per 24 hour  Intake 2988.33 ml  Output   2590 ml  Net 398.33 ml   Filed Weights   04/16/14 1611 04/16/14 2014  Weight: 89.812 kg (198 lb) 91.5 kg (201 lb 11.5 oz)    General appearance: alert, cooperative, appears stated age and no distress Resp: clear to auscultation bilaterally Cardio: regular rate and rhythm, S1, S2 normal, no murmur, click, rub or gallop GI: soft, tender in RUQ; bowel sounds normal; no masses,  no organomegaly Extremities: extremities normal, atraumatic, no cyanosis or edema  Lab Results:  Basic Metabolic Panel:  Recent Labs Lab 04/16/14 1700 04/17/14 0601 04/18/14 0602  NA 136 136 139  K 4.1 3.7 4.1    CL 100 101 103  CO2 30 29 32  GLUCOSE 105* 104* 96  BUN 20 17 8   CREATININE 1.40* 1.06 0.91  CALCIUM 8.8 8.4 8.8   Liver Function Tests:  Recent Labs Lab 04/16/14 1700 04/17/14 0601 04/18/14 0602  AST 130* 127* 71*  ALT 69* 132* 111*  ALKPHOS 147* 144* 140*  BILITOT 1.4* 2.3* 0.8  PROT 7.6 6.2 6.9  ALBUMIN 3.7 2.9* 3.2*    Recent Labs Lab 04/16/14 1700  LIPASE 26   CBC:  Recent Labs Lab 04/16/14 1700 04/17/14 0601 04/18/14 0602 04/19/14 0557  WBC 18.9* 10.1 6.7 12.9*  NEUTROABS 16.0*  --   --   --   HGB 14.4 12.5* 13.2 11.2*  HCT 42.6 37.2* 40.4 33.5*  MCV 93.2 93.9 95.1 94.4  PLT 390 389 416* 375    Studies/Results: Mr 3d Recon At Scanner  04/17/2014   CLINICAL DATA:  Abdominal pain and elevated liver function studies. Abnormal ultrasound.  EXAM: MRI ABDOMEN WITHOUT AND WITH CONTRAST (INCLUDING MRCP)  TECHNIQUE: Multiplanar multisequence MR imaging of the abdomen was performed both before and after the administration of intravenous contrast. Heavily T2-weighted images of the biliary and pancreatic ducts were obtained, and three-dimensional MRCP images were rendered by post processing.  CONTRAST:  70mL MULTIHANCE GADOBENATE DIMEGLUMINE 529 MG/ML IV SOLN  COMPARISON:  Ultrasound 04/16/2014  FINDINGS: No focal hepatic lesions or intrahepatic biliary dilatation. The gallbladder has a slightly thickened wall  and there are small layering gallstones and sludge. The common bile duct has a normal caliber and normal course. The pancreatic duct is normal. No common bile duct stones are identified.  The pancreas is unremarkable. No pancreatic inflammation an or mass. There is a small duodenum diverticulum adjacent to the pancreatic head.  In the left upper quadrant there are soft tissue densities which probably reflect splenic tissue. I suspect the patient had previous splenic trauma or splenectomy with regenerated splenic tissue in the left upper quadrant. Recommend clinical  correlation.  The adrenal glands and kidneys are unremarkable. There are small bilateral renal cysts. No worrisome renal lesions or hydronephrosis.  The stomach, duodenum, small bowel and colon are grossly normal. No mesenteric or retroperitoneal mass or adenopathy. Small scattered lymph nodes are noted. The aorta and branch vessels are patent. The major venous structures are patent.  No significant bony findings. Lumbar scoliosis and degenerative lumbar spondylosis are noted.  IMPRESSION: Cholelithiasis, gallbladder sludge and mild wall thickening suggesting acute cholecystitis.  No intra or extrahepatic biliary dilatation. No common bile duct stones.  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   Electronically Signed   By: Kalman Jewels M.D.   On: 04/17/2014 11:42   [name] Cm/mrcp  04/17/2014   CLINICAL DATA:  Abdominal pain and elevated liver function studies. Abnormal ultrasound.  EXAM: MRI ABDOMEN WITHOUT AND WITH CONTRAST (INCLUDING MRCP)  TECHNIQUE: Multiplanar multisequence MR imaging of the abdomen was performed both before and after the administration of intravenous contrast. Heavily T2-weighted images of the biliary and pancreatic ducts were obtained, and three-dimensional MRCP images were rendered by post processing.  CONTRAST:  71mL MULTIHANCE GADOBENATE DIMEGLUMINE 529 MG/ML IV SOLN  COMPARISON:  Ultrasound 04/16/2014  FINDINGS: No focal hepatic lesions or intrahepatic biliary dilatation. The gallbladder has a slightly thickened wall and there are small layering gallstones and sludge. The common bile duct has a normal caliber and normal course. The pancreatic duct is normal. No common bile duct stones are identified.  The pancreas is unremarkable. No pancreatic inflammation an or mass. There is a small duodenum diverticulum adjacent to the pancreatic head.  In the left upper quadrant there are soft tissue densities which probably reflect  splenic tissue. I suspect the patient had previous splenic trauma or splenectomy with regenerated splenic tissue in the left upper quadrant. Recommend clinical correlation.  The adrenal glands and kidneys are unremarkable. There are small bilateral renal cysts. No worrisome renal lesions or hydronephrosis.  The stomach, duodenum, small bowel and colon are grossly normal. No mesenteric or retroperitoneal mass or adenopathy. Small scattered lymph nodes are noted. The aorta and branch vessels are patent. The major venous structures are patent.  No significant bony findings. Lumbar scoliosis and degenerative lumbar spondylosis are noted.  IMPRESSION: Cholelithiasis, gallbladder sludge and mild wall thickening suggesting acute cholecystitis.  No intra or extrahepatic biliary dilatation. No common bile duct stones.  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   Electronically Signed   By: Kalman Jewels M.D.   On: 04/17/2014 11:42    Medications:  Scheduled: . ampicillin-sulbactam (UNASYN) IV  3 g Intravenous Q8H  . enoxaparin (LOVENOX) injection  40 mg Subcutaneous Q24H  . finasteride  5 mg Oral Daily  . metoprolol succinate  25 mg Oral Daily  . pantoprazole  40 mg Oral Daily  . senna  1 tablet Oral BID  . tamsulosin  0.4  mg Oral Daily   Continuous: . lactated ringers 100 mL/hr at 04/19/14 0330   MOL:MBEMLJQGBEEFE (DILAUDID) injection, ondansetron **OR** ondansetron (ZOFRAN) IV, oxyCODONE-acetaminophen, polyethylene glycol  Assessment/Plan:  Principal Problem:   Acute cholecystitis Active Problems:   BPH (benign prostatic hyperplasia)   AKI (acute kidney injury)   Benign essential HTN   HLD (hyperlipidemia)   GERD (gastroesophageal reflux disease)   Transaminitis    Acute Cholecystitis  Patient is status post laparoscopic cholecystectomy. He's got a biliary drain. General surgery to follow and manage. Continues to be on Unasyn. Slight  elevation in WBC today. Electrolytes and LFTs are pending.   Mild AKI Resolved with IV fluids. Continue for now at lower rate.  Essential HTN Blood pressure is noted to be slightly low this morning. Will be rechecked. Continue current medications.  History of coronary artery disease He is status post bypass surgery about 5 years ago. He had a negative cardiac stress test in July of last year. Does not have any chest pain or shortness of breath currently. Continue with his aspirin and his beta blocker. Appears to be stable from a cardiac perspective.  History of BPH Continue proscar and flomax  GERD Continue PPI  HLD Hold Zocor.  DVT Prophylaxis: Heparin    Code Status: Full code  Family Communication: Discussed in detail with the patient  Disposition Plan: Discharge per general surgery.    LOS: 3 days   [facility] Pager [phone] 04/19/2014, 8:17 AM  If 7PM-7AM, please contact night-coverage at [email or web address], password [facility]

## 2014-04-19 NOTE — Anesthesia Postprocedure Evaluation (Signed)
  Anesthesia Post-op Note  Patient: Matthew Butler  Procedure(s) Performed: Procedure(s): LAPAROSCOPIC CHOLECYSTECTOMY (N/A)  Patient Location: PACU, PICU and NICU  Anesthesia Type:General  Level of Consciousness: awake, alert , oriented and patient cooperative  Airway and Oxygen Therapy: Patient Spontanous Breathing  Post-op Pain: 3 /10, mild  Post-op Assessment: Post-op Vital signs reviewed, Patient's Cardiovascular Status Stable, Respiratory Function Stable, Patent Airway, No signs of Nausea or vomiting, Adequate PO intake and Pain level controlled  Post-op Vital Signs: Reviewed and stable  Last Vitals:  Filed Vitals:   04/19/14 0649  BP: 100/43  Pulse: 80  Temp: 36.9 C  Resp: 18    Complications: No apparent anesthesia complications

## 2014-04-19 NOTE — Addendum Note (Signed)
Addendum  created 04/19/14 1119 by Charmaine Downs, CRNA   Modules edited: Notes Section   Notes Section:  File: [id number]

## 2014-04-19 NOTE — Progress Notes (Signed)
1 Day Post-Op  Subjective: Mild incisional pain, but definitely feels better.  Objective: Vital signs in last 24 hours: Temp:  [97.3 F (36.3 C)-98.6 F (37 C)] 98.4 F (36.9 C) (01/16 0649) Pulse Rate:  [71-86] 80 (01/16 0649) Resp:  [12-20] 18 (01/16 0649) BP: (100-147)/(43-75) 100/43 mmHg (01/16 0649) SpO2:  [91 %-100 %] 100 % (01/16 0649) Last BM Date: 04/18/14  Intake/Output from previous day: 01/15 0701 - 01/16 0700 In: 2988.3 [P.O.:320; I.V.:2393.3; IV Piggyback:200] Out: 2590 [Urine:2550; Drains:40] Intake/Output this shift: Total I/O In: 240 [P.O.:240] Out: -   General appearance: alert, cooperative and no distress Resp: clear to auscultation bilaterally Cardio: regular rate and rhythm, S1, S2 normal, no murmur, click, rub or gallop GI: Soft. Dressings dry and intact. JP drainage serosanguineous in nature. No bile seen.  Lab Results:   Recent Labs  04/18/14 0602 04/19/14 0557  WBC 6.7 12.9*  HGB 13.2 11.2*  HCT 40.4 33.5*  PLT 416* 375   BMET  Recent Labs  04/18/14 0602 04/19/14 0557  NA 139 135  K 4.1 3.8  CL 103 99  CO2 32 30  GLUCOSE 96 109*  BUN 8 9  CREATININE 0.91 1.02  CALCIUM 8.8 8.1*   PT/INR No results for input(s): LABPROT, INR in the last 72 hours.  Studies/Results: No results found.  Anti-infectives: Anti-infectives    Start     Dose/Rate Route Frequency Ordered Stop   04/17/14 0300  Ampicillin-Sulbactam (UNASYN) 3 g in sodium chloride 0.9 % 100 mL IVPB     3 g100 mL/hr over 60 Minutes Intravenous Every 8 hours 04/16/14 2020     04/16/14 2000  Ampicillin-Sulbactam (UNASYN) 3 g in sodium chloride 0.9 % 100 mL IVPB  Status:  Discontinued     3 g100 mL/hr over 60 Minutes Intravenous Every 8 hours 04/16/14 1947 04/16/14 2019   04/16/14 1830  Ampicillin-Sulbactam (UNASYN) 3 g in sodium chloride 0.9 % 100 mL IVPB     3 g100 mL/hr over 60 Minutes Intravenous  Once 04/16/14 1826 04/16/14 1951      Assessment/Plan: s/p  Procedure(s): LAPAROSCOPIC CHOLECYSTECTOMY Impression: Stable postoperatively. Liver enzyme test improving. Does have leukocytosis which is not surprising given findings at time of surgery. We'll continue IV Unasyn. Will monitor blood pressure.  LOS: 3 days    Brylin Stanislawski A 04/19/2014

## 2014-04-20 LAB — CBC
HCT: 33.5 % — ABNORMAL LOW (ref 39.0–52.0)
Hemoglobin: 11 g/dL — ABNORMAL LOW (ref 13.0–17.0)
MCH: 31.4 pg (ref 26.0–34.0)
MCHC: 32.8 g/dL (ref 30.0–36.0)
MCV: 95.7 fL (ref 78.0–100.0)
Platelets: 390 10*3/uL (ref 150–400)
RBC: 3.5 MIL/uL — ABNORMAL LOW (ref 4.22–5.81)
RDW: 14.3 % (ref 11.5–15.5)
WBC: 11.9 10*3/uL — ABNORMAL HIGH (ref 4.0–10.5)

## 2014-04-20 MED ORDER — AMOXICILLIN-POT CLAVULANATE 875-125 MG PO TABS: 1.0000 | ORAL_TABLET | Freq: Two times a day (BID) | ORAL | Status: DC

## 2014-04-20 MED ORDER — HYDROCODONE-ACETAMINOPHEN 5-325 MG PO TABS: 1.0000 | ORAL_TABLET | Freq: Four times a day (QID) | ORAL | Status: DC | PRN

## 2014-04-20 NOTE — Progress Notes (Signed)
Pt discharged home today per Dr. Arnoldo Morale. Pt's IV site D/C'd and WDL. Pt's VSS. Pt given handout on home care of JP drain. Also educated by Dr. Arnoldo Morale. Verbalized understanding. Pt provided with home medication list, discharge instructions and prescriptions. Verbalized understanding. Pt left floor via WC in stable condition accompanied by RN.

## 2014-04-20 NOTE — Discharge Summary (Signed)
Physician Discharge Summary  Patient ID: Matthew Butler MRN: [id number] DOB/AGE: 69-Mar-1947 69 y.o.  Admit date: 04/16/2014 Discharge date: 04/20/2014  Admission Diagnoses: Acute cholecystitis, cholelithiasis  Discharge Diagnoses: Same Principal Problem:   Acute cholecystitis Active Problems:   BPH (benign prostatic hyperplasia)   AKI (acute kidney injury)   Benign essential HTN   HLD (hyperlipidemia)   GERD (gastroesophageal reflux disease)   Transaminitis   Discharged Condition: good  Hospital Course: Patient is a 69 year old white male who presented to the emergency room with worsening right upper quadrant abdominal pain. He was found on ultrasound the gallbladder to have a thickened gallbladder wall, cholecystitis, with cholelithiasis. He did have elevated liver enzyme tests as well as an elevated total bilirubin. MRCP was performed on 04/17/2014 which showed no choledocholithiasis. Subsequent went to the operating room on 04/18/2014 and underwent a laparoscopic cholecystectomy. Tolerated procedure well. He had a significantly inflamed gallbladder. A JP drain was left in the subhepatic space. His postoperative course has been for the most part unremarkable. His diet was advanced at difficulty. He is being discharged home on 04/20/2014 in good improving condition.  Significant Diagnostic Studies: radiology: MRI: MRCP negative for choledocholithiasis  Treatments: surgery: Laparoscopic cholecystectomy on 04/18/2014  Discharge Exam: Blood pressure 120/58, pulse 90, temperature 98.8 F (37.1 C), temperature source Oral, resp. rate 20, height '5\' 11"'  (1.803 m), weight 91.5 kg (201 lb 11.5 oz), SpO2 94 %. General appearance: alert, cooperative and no distress Resp: clear to auscultation bilaterally Cardio: regular rate and rhythm, S1, S2 normal, no murmur, click, rub or gallop GI: Soft, incisions healing well. JP drainage serosanguineous in nature. No bile noted.  Disposition: Home     Medication List    TAKE these medications        amoxicillin-clavulanate 875-125 MG per tablet  Commonly known as:  AUGMENTIN  Take 1 tablet by mouth 2 (two) times daily.     aspirin EC 81 MG tablet  Take 81 mg by mouth daily.     esomeprazole 20 MG capsule  Commonly known as:  NEXIUM  Take 40 mg by mouth daily.     finasteride 5 MG tablet  Commonly known as:  PROSCAR  Take 5 mg by mouth daily.     HYDROcodone-acetaminophen 5-325 MG per tablet  Commonly known as:  NORCO/VICODIN  Take 1-2 tablets by mouth every 6 (six) hours as needed (pain).     metoprolol succinate 25 MG 24 hr tablet  Commonly known as:  TOPROL-XL  Take 25 mg by mouth daily.     MOVIPREP 100 G Solr  Generic drug:  peg 3350 powder  Take 1 kit (200 g total) by mouth once.     ondansetron 4 MG disintegrating tablet  Commonly known as:  ZOFRAN ODT  Take 1 tablet (4 mg total) by mouth every 8 (eight) hours as needed for nausea or vomiting.     ranitidine 150 MG tablet  Commonly known as:  ZANTAC  Take 150 mg by mouth every evening.     simvastatin 40 MG tablet  Commonly known as:  ZOCOR  Take 40 mg by mouth at bedtime.     tamsulosin 0.4 MG Caps capsule  Commonly known as:  FLOMAX  Take 0.4 mg by mouth daily.           Follow-up Information    Follow up with Jamesetta So, MD. Schedule an appointment as soon as possible for a visit on 04/24/2014.   Specialty:  General  Surgery   Contact information:   [address] [address] [phone]       Signed: Aviva Signs A 04/20/2014, 11:27 AM

## 2014-04-20 NOTE — Progress Notes (Signed)
[facility] PROGRESS NOTE  Audwin Semper XLK:[id number] DOB: 07/22/1945 DOA: 04/16/2014  PCP: Dr. Alroy Dust in [place]  Brief HPI: 69 year old with a history of coronary artery disease, presented with right upper quadrant abdominal pain. He had elevated LFTs and his ultrasound was suspicious for cholecystitis. General surgery was consulted. He underwent laparoscopic cholecystectomy 1/15.  Past medical history:  Past Medical History  Diagnosis Date  . Hodgkin disease   . Coronary artery disease 2011    Consultants: Gen. surgery  Procedures: Cholecystectomy 1/15  Antibiotics: Unasyn  Subjective: Patient feels well. Pain is present but better. As ambulated. Tolerating diet. Passing gas. No chest pain.  Objective: Vital Signs  Filed Vitals:   04/19/14 0649 04/19/14 1537 04/19/14 2151 04/20/14 0605  BP: 100/43 121/60 131/70 120/58  Pulse: 80 82 87 90  Temp: 98.4 F (36.9 C) 98.5 F (36.9 C) 98.7 F (37.1 C) 98.8 F (37.1 C)  TempSrc: Oral Oral Oral Oral  Resp: 18 20 20 20   Height:      Weight:      SpO2: 100% 95% 92% 94%    Intake/Output Summary (Last 24 hours) at 04/20/14 0742 Last data filed at 04/20/14 0600  Gross per 24 hour  Intake 2927.92 ml  Output   1335 ml  Net 1592.92 ml   Filed Weights   04/16/14 1611 04/16/14 2014  Weight: 89.812 kg (198 lb) 91.5 kg (201 lb 11.5 oz)    General appearance: alert, cooperative, appears stated age and no distress Resp: clear to auscultation bilaterally Cardio: regular rate and rhythm, S1, S2 normal, no murmur, click, rub or gallop GI: soft, some tenderness in RUQ; bowel sounds present; no masses,  no organomegaly Extremities: extremities normal, atraumatic, no cyanosis or edema  Lab Results:  Basic Metabolic Panel:  Recent Labs Lab 04/16/14 1700 04/17/14 0601 04/18/14 0602 04/19/14 0557  NA 136 136 139 135  K 4.1 3.7 4.1 3.8  CL 100 101 103 99  CO2 30 29 32 30  GLUCOSE 105* 104* 96 109*  BUN 20 17  8 9   CREATININE 1.40* 1.06 0.91 1.02  CALCIUM 8.8 8.4 8.8 8.1*  MG  --   --   --  1.8  PHOS  --   --   --  4.0   Liver Function Tests:  Recent Labs Lab 04/16/14 1700 04/17/14 0601 04/18/14 0602 04/19/14 0557  AST 130* 127* 71* 47*  ALT 69* 132* 111* 76*  ALKPHOS 147* 144* 140* 99  BILITOT 1.4* 2.3* 0.8 0.7  PROT 7.6 6.2 6.9 5.8*  ALBUMIN 3.7 2.9* 3.2* 2.5*    Recent Labs Lab 04/16/14 1700  LIPASE 26   CBC:  Recent Labs Lab 04/16/14 1700 04/17/14 0601 04/18/14 0602 04/19/14 0557 04/20/14 0558  WBC 18.9* 10.1 6.7 12.9* 11.9*  NEUTROABS 16.0*  --   --   --   --   HGB 14.4 12.5* 13.2 11.2* 11.0*  HCT 42.6 37.2* 40.4 33.5* 33.5*  MCV 93.2 93.9 95.1 94.4 95.7  PLT 390 389 416* 375 390    Studies/Results: No results found.  Medications:  Scheduled: . ampicillin-sulbactam (UNASYN) IV  3 g Intravenous Q8H  . enoxaparin (LOVENOX) injection  40 mg Subcutaneous Q24H  . finasteride  5 mg Oral Daily  . metoprolol succinate  25 mg Oral Daily  . pantoprazole  40 mg Oral Daily  . senna  1 tablet Oral BID  . tamsulosin  0.4 mg Oral Daily   Continuous: . lactated ringers  75 mL/hr at 04/19/14 5956   LOV:FIEPPIRJJOACZ (DILAUDID) injection, magnesium hydroxide, ondansetron **OR** ondansetron (ZOFRAN) IV, oxyCODONE-acetaminophen, polyethylene glycol  Assessment/Plan:  Principal Problem:   Acute cholecystitis Active Problems:   BPH (benign prostatic hyperplasia)   AKI (acute kidney injury)   Benign essential HTN   HLD (hyperlipidemia)   GERD (gastroesophageal reflux disease)   Transaminitis    Acute Cholecystitis  Patient is status post laparoscopic cholecystectomy. Biliary drain is present. General surgery managing. Continues to be on Unasyn. WBC is stable. Afebrile. Electrolytes and LFTs are pending.   Mild AKI Resolved with IV fluids. Continue for now at lower rate.  Essential HTN Blood pressure is stable. Continue current medications.  History of  coronary artery disease He is status post bypass surgery about 5 years ago. He had a negative cardiac stress test in July of last year. Does not have any chest pain or shortness of breath currently. Continue with his aspirin and his beta blocker. Appears to be stable from a cardiac perspective.  History of BPH Continue proscar and flomax  GERD Continue PPI  HLD Holding Zocor. Can be resumed at discharge.  DVT Prophylaxis: Heparin    Code Status: Full code  Family Communication: Discussed in detail with the patient  Disposition Plan: Patient is medically stable. He will need to see his PCP/Cardiologist a few weeks after discharge. [facility] will sign off. Please call with questions.    LOS: 4 days   [place] [facility] Pager [phone] 04/20/2014, 7:42 AM  If 7PM-7AM, please contact night-coverage at [email or web address], password [facility]

## 2014-04-20 NOTE — Discharge Instructions (Signed)
Bulb Drain Home Care A bulb drain consists of a thin rubber tube and a soft, round bulb that creates a gentle suction. The rubber tube is placed in the area where you had surgery. A bulb is attached to the end of the tube that is outside the body. The bulb drain removes excess fluid that normally builds up in a surgical wound after surgery. The color and amount of fluid will vary. Immediately after surgery, the fluid is bright red and is a little thicker than water. It may gradually change to a yellow or pink color and become more thin and water-like. When the amount decreases to about 1 or 2 tbsp in 24 hours, your health care provider will usually remove it. DAILY CARE  Keep the bulb flat (compressed) at all times, except while emptying it. The flatness creates suction. You can flatten the bulb by squeezing it firmly in the middle and then closing the cap.  Keep sites where the tube enters the skin dry and covered with a bandage (dressing).  Secure the tube 1-2 in (2.5-5.1 cm) below the insertion sites to keep it from pulling on your stitches. The tube is stitched in place and will not slip out.  Secure the bulb as directed by your health care provider.  For the first 3 days after surgery, there usually is more fluid in the bulb. Empty the bulb whenever it becomes half full because the bulb does not create enough suction if it is too full. The bulb could also overflow. Write down how much fluid you remove each time you empty your drain. Add up the amount removed in 24 hours.  Empty the bulb at the same time every day once the amount of fluid decreases and you only need to empty it once a day. Write down the amounts and the 24-hour totals to give to your health care provider. This helps your health care provider know when the tubes can be removed. EMPTYING THE BULB DRAIN Before emptying the bulb, get a measuring cup, a piece of paper and a pen, and wash your hands.  Gently run your fingers down the  tube (stripping) to empty any drainage from the tubing into the bulb. This may need to be done several times a day to clear the tubing of clots and tissue.  Open the bulb cap to release suction, which causes it to inflate. Do not touch the inside of the cap.  Gently run your fingers down the tube (stripping) to empty any drainage from the tubing into the bulb.  Hold the cap out of the way, and pour fluid into the measuring cup.   Squeeze the bulb to provide suction.  Replace the cap.   Check the tape that holds the tube to your skin. If it is becoming loose, you can remove the loose piece of tape and apply a new one. Then, pin the bulb to your shirt.   Write down the amount of fluid you emptied out. Write down the date and each time you emptied your bulb drain. (If there are 2 bulbs, note the amount of drainage from each bulb and keep the totals separate. Your health care provider will want to know the total amounts for each drain and which tube is draining more.)   Flush the fluid down the toilet and wash your hands.   Call your health care provider once you have less than 2 tbsp of fluid collecting in the bulb drain every 24 hours. If  there is drainage around the tube site, change dressings and keep the area dry. Cleanse around tube with sterile saline and place dry gauze around site. This gauze should be changed when it is soiled. If it stays clean and unsoiled, it should still be changed daily.  °SEEK MEDICAL CARE IF: °· Your drainage has a bad smell or is cloudy.   °· You have a fever.   °· Your drainage is increasing instead of decreasing.   °· Your tube fell out.   °· You have redness or swelling around the tube site.   °· You have drainage from a surgical wound.   °· Your bulb drain will not stay flat after you empty it.   °MAKE SURE YOU:  °· Understand these instructions. °· Will watch your condition. °· Will get help right away if you are not doing well or get worse. °Document  Released: 03/18/2000 Document Revised: 08/05/2013 Document Reviewed: 08/24/2011 °[employer]® Patient Information ©2015 [employer]. This information is not intended to replace advice given to you by your health care provider. Make sure you discuss any questions you have with your health care provider. °Laparoscopic Cholecystectomy, Care After °Refer to this sheet in the next few weeks. These instructions provide you with information on caring for yourself after your procedure. Your health care provider may also give you more specific instructions. Your treatment has been planned according to current medical practices, but problems sometimes occur. Call your health care provider if you have any problems or questions after your procedure. °WHAT TO EXPECT AFTER THE PROCEDURE °After your procedure, it is typical to have the following: °· Pain at your incision sites. You will be given pain medicines to control the pain. °· Mild nausea or vomiting. This should improve after the first 24 hours. °· Bloating and possibly shoulder pain from the gas used during the procedure. This will improve after the first 24 hours. °HOME CARE INSTRUCTIONS  °· Change bandages (dressings) as directed by your health care provider. °· Keep the wound dry and clean. You may wash the wound gently with soap and water. Gently blot or dab the area dry. °· Do not take baths or use swimming pools or hot tubs for 2 weeks or until your health care provider approves. °· Only take over-the-counter or prescription medicines as directed by your health care provider. °· Continue your normal diet as directed by your health care provider. °· Do not lift anything heavier than 10 pounds (4.5 kg) until your health care provider approves. °· Do not play contact sports for 1 week or until your health care provider approves. °SEEK MEDICAL CARE IF:  °· You have redness, swelling, or increasing pain in the wound. °· You notice yellowish-white fluid (pus) coming from  the wound. °· You have drainage from the wound that lasts longer than 1 day. °· You notice a bad smell coming from the wound or dressing. °· Your surgical cuts (incisions) break open. °SEEK IMMEDIATE MEDICAL CARE IF:  °· You develop a rash. °· You have difficulty breathing. °· You have chest pain. °· You have a fever. °· You have increasing pain in the shoulders (shoulder strap areas). °· You have dizzy episodes or faint while standing. °· You have severe abdominal pain. °· You feel sick to your stomach (nauseous) or throw up (vomit) and this lasts for more than 1 day. °Document Released: 03/21/2005 Document Revised: 01/09/2013 Document Reviewed: 10/31/2012 °ExitCare® Patient Information ©2015 [employer]. This information is not intended to replace advice given to you by   your health care provider. Make sure you discuss any questions you have with your health care provider. ° °

## 2014-04-21 ENCOUNTER — Encounter (HOSPITAL_COMMUNITY): Payer: Self-pay | Admitting: General Surgery

## 2014-05-07 ENCOUNTER — Encounter: Payer: Medicare Other | Admitting: Internal Medicine

## 2015-11-18 IMAGING — CR DG CHEST 2V
2 series · 2 of 2 positions shown · non-contrast
Comparison: None.

CLINICAL DATA: Acute right upper quadrant pain with nausea for 1
week

EXAM:
CHEST  2 VIEW

[view not recorded (1 of 2)]
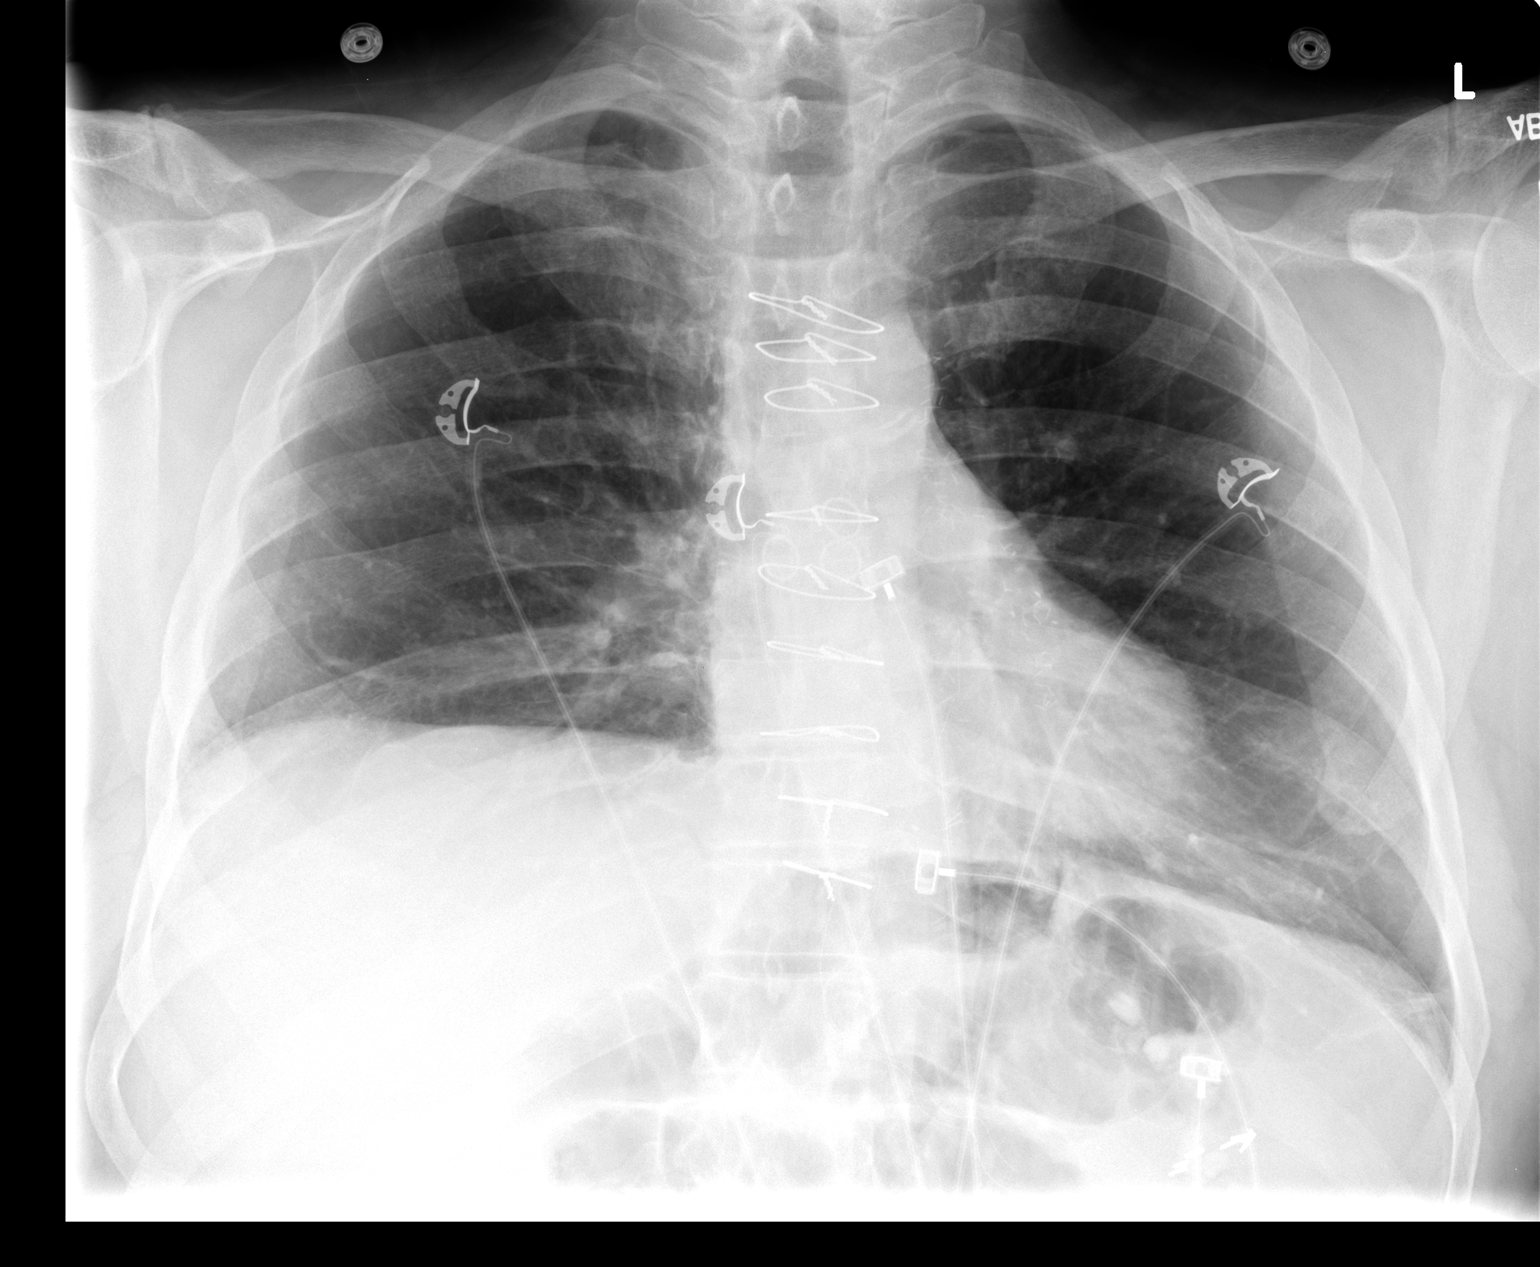

[view not recorded (2 of 2)]
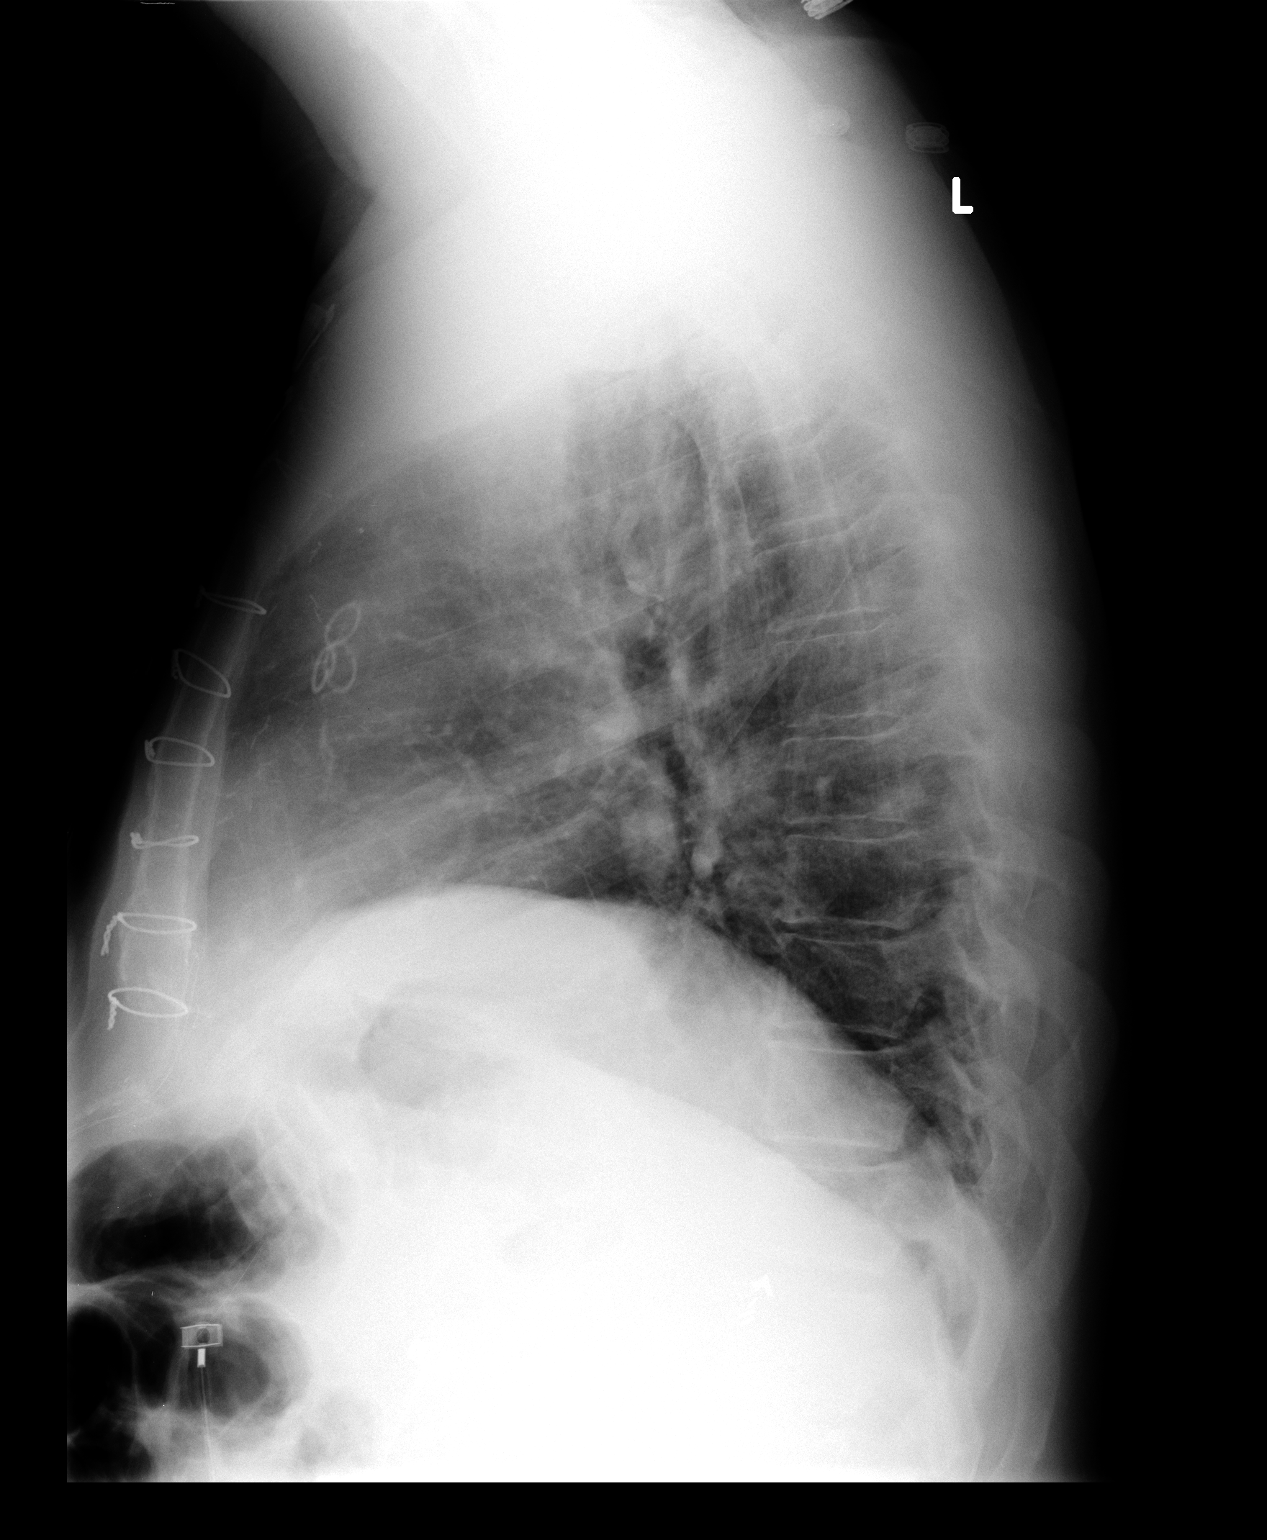

[2 of 2 positions shown; findings below may reference images not displayed]

FINDINGS: Prior coronary bypass changes noted. Normal heart size and
vascularity. Low lung volumes with slight elevation of right
hemidiaphragm. No superimposed pneumonia, collapse or consolidation.
Negative for edema, effusion or pneumothorax. Trachea midline.
IMPRESSION: Prior coronary bypass.

Low lung volumes without acute process.

## 2016-04-04 HISTORY — PX: CORONARY ANGIOPLASTY WITH STENT PLACEMENT: SHX49

## 2018-02-05 ENCOUNTER — Encounter: Payer: Self-pay | Admitting: Internal Medicine

## 2018-02-07 ENCOUNTER — Other Ambulatory Visit: Payer: Self-pay | Admitting: *Deleted

## 2018-02-07 ENCOUNTER — Encounter: Payer: Self-pay | Admitting: Gastroenterology

## 2018-02-07 ENCOUNTER — Ambulatory Visit (INDEPENDENT_AMBULATORY_CARE_PROVIDER_SITE_OTHER): Payer: Medicare Other | Admitting: Gastroenterology

## 2018-02-07 ENCOUNTER — Encounter: Payer: Self-pay | Admitting: *Deleted

## 2018-02-07 VITALS — BP 114/64 | HR 79 | Temp 98.8°F | Ht 71.0 in | Wt 211.8 lb

## 2018-02-07 DIAGNOSIS — R195 Other fecal abnormalities: Secondary | ICD-10-CM | POA: Diagnosis not present

## 2018-02-07 DIAGNOSIS — K219 Gastro-esophageal reflux disease without esophagitis: Secondary | ICD-10-CM | POA: Diagnosis not present

## 2018-02-07 MED ORDER — PANTOPRAZOLE SODIUM 40 MG PO TBEC: 40.0000 mg | DELAYED_RELEASE_TABLET | Freq: Two times a day (BID) | ORAL | 5 refills | Status: DC

## 2018-02-07 MED ORDER — NA SULFATE-K SULFATE-MG SULF 17.5-3.13-1.6 GM/177ML PO SOLN: 1.0000 | ORAL | 0 refills | Status: DC

## 2018-02-07 NOTE — Patient Instructions (Signed)
1. Increase pantoprazole to 40 mg twice daily before a meal.  New prescription sent to your pharmacy. 2. Colonoscopy as scheduled.  Please see separate instructions.

## 2018-02-07 NOTE — H&P (View-Only) (Signed)
Primary Care Physician:  Earney Mallet, MD  Primary Gastroenterologist:  Garfield Cornea, MD   Chief Complaint  Patient presents with  . +Cologuard    never had tcs  . gerd    HPI:  Matthew Butler is a 72 y.o. male here at the request of Dr. Macarthur Critchley to schedule colonoscopy.  Recently had positive Cologuard test.  Patient has never had a colonoscopy.  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  CABG in 0938 complicated by pulmonary embolus.  In August 2018, he had coronary artery stent placement.  He has been on Plavix since that time.  Patient also gives a history of remote bleeding duodenal ulcer.  Was diagnosed with H. pylori and underwent treatment at that time.  Did very well for a long period of time.  For the past 1 year he has had recurrent reflux, sometimes pretty severe.  He believes the symptoms have been since he started Plavix. Wakes up in the middle the night with regurgitation, severe heartburn, choking on acid.  Seems to be worse with certain foods.  Sometimes he has to sleep in a recliner.  Takes pantoprazole in the mornings.  Sometimes as ranitidine at nighttime.  No dysphagia.  No hematemesis.  No melena or rectal bleeding.  Bowel movements are regular.  No abdominal pain.  Patient rarely takes tramadol, last prescription for #40 filled in 2018.  Current Outpatient Medications  Medication Sig Dispense Refill  . aspirin EC 81 MG tablet Take 81 mg by mouth daily.    . brimonidine (ALPHAGAN) 0.2 % ophthalmic solution Place 1 drop into both eyes 3 (three) times daily.  5  . clopidogrel (PLAVIX) 75 MG tablet Take 75 mg by mouth daily.  5  . dorzolamide (TRUSOPT) 2 % ophthalmic solution Place 1 drop into both eyes 3 (three) times daily.  5  . finasteride (PROSCAR) 5 MG tablet Take 5 mg by mouth daily.     . metoprolol succinate (TOPROL-XL) 25 MG 24 hr tablet Take 25 mg by mouth daily.     . ondansetron (ZOFRAN ODT) 4 MG disintegrating  tablet Take 1 tablet (4 mg total) by mouth every 8 (eight) hours as needed for nausea or vomiting. 20 tablet 3  . pantoprazole (PROTONIX) 40 MG tablet Take 1 tablet by mouth daily.    . predniSONE (DELTASONE) 5 MG tablet Take 5 mg by mouth daily.  1  . ranitidine (ZANTAC) 150 MG tablet Take 150 mg by mouth as needed.     . simvastatin (ZOCOR) 40 MG tablet Take 40 mg by mouth at bedtime.     . tamsulosin (FLOMAX) 0.4 MG CAPS capsule Take 0.4 mg by mouth daily.     . traMADol (ULTRAM) 50 MG tablet Take 50 mg by mouth as needed.     No current facility-administered medications for this visit.     Allergies as of 02/07/2018  . (No Known Allergies)    Past Medical History:  Diagnosis Date  . BPH (benign prostatic hyperplasia)   . Coronary artery disease 2011  . GERD (gastroesophageal reflux disease)   . 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 Hodgkin disease ([place])   . HTN (hypertension)   . Hyperlipidemia     Past Surgical History:  Procedure Laterality Date  . APPENDECTOMY    . CHOLECYSTECTOMY N/A 04/18/2014   Procedure: LAPAROSCOPIC CHOLECYSTECTOMY;  Surgeon: Jamesetta So, MD;  Location: [facility];  Service: General;  Laterality: N/A;  . CORONARY ANGIOPLASTY WITH STENT PLACEMENT  2018  . CORONARY ARTERY BYPASS GRAFT  2011  . KNEE ARTHROPLASTY    . MELANOMA EXCISION     Left face  . OTHER SURGICAL HISTORY  1980   Splenectomy    Family History  Problem Relation Age of Onset  . Melanoma Maternal Grandmother   . Congestive Heart Failure Mother   . Deep vein thrombosis Mother   . Bowel Disease Father        Volvulus  . Heart attack Brother   . Diabetes Brother     Social History   Socioeconomic History  . Marital status: Married    Spouse name: Not on file  . Number of children: 1  . Years of education: Not on file  . Highest education level: Not on file  Occupational History  . Occupation: Retired    Comment: Retired Engineer, technical sales  . Financial resource strain: Not on file  . Food insecurity:    Worry: Not on file    Inability: Not on file  . Transportation needs:    Medical: Not on file    Non-medical: Not on file  Tobacco Use  . Smoking status: Former Smoker    Types: Cigars  . Smokeless tobacco: Never Used  Substance and Sexual Activity  . Alcohol use: Not Currently    Alcohol/week: 0.0 standard drinks    Comment: occassional; denied 02/07/18  . Drug use: No  . Sexual activity: Not on file  Lifestyle  . Physical activity:    Days per week: Not on file    Minutes per session: Not on file  . Stress: Not on file  Relationships  . Social connections:    Talks on phone: Not on file    Gets together: Not on file    Attends religious service: Not on file    Active member of club or organization: Not on file    Attends meetings of clubs or organizations: Not on file    Relationship status: Not on file  . Intimate partner violence:    Fear of current or ex partner: Not on file    Emotionally abused: Not on file    Physically abused: Not on file    Forced sexual activity: Not on file  Other Topics Concern  . Not on file  Social History Narrative  . Not on file      ROS:  General: Negative for anorexia, weight loss, fever, chills, fatigue, weakness. Eyes: Negative for vision changes.  ENT: Negative for hoarseness, difficulty swallowing , nasal congestion. CV: Negative for chest pain, angina, palpitations, dyspnea on exertion, peripheral edema.  Respiratory: Negative for dyspnea at rest, dyspnea on exertion, cough, sputum, wheezing.  GI: See history of present illness. GU:  Negative for dysuria, hematuria, urinary incontinence, urinary frequency, nocturnal urination.  MS: Negative for joint pain, low back pain.  Derm: Negative for rash or itching.  Neuro: Negative for weakness, abnormal sensation, seizure, frequent headaches, memory loss, confusion.  Psych: Negative for anxiety,  depression, suicidal ideation, hallucinations.  Endo: Negative for unusual weight change.  Heme: Negative for bruising or bleeding. Allergy: Negative for rash or hives.    Physical Examination:  BP 114/64   Pulse 79   Temp 98.8 F (37.1 C) (Oral)   Ht 5\' 11"  (1.803 m)   Wt 211 lb 12.8 oz (96.1 kg)   BMI 29.54 kg/m  General: Well-nourished, well-developed in no acute distress.  Head: Normocephalic, atraumatic.   Eyes: Conjunctiva pink, no icterus. Mouth: Oropharyngeal mucosa moist and pink , no lesions erythema or exudate. Neck: Supple without thyromegaly, masses, or lymphadenopathy.  Lungs: Clear to auscultation bilaterally.  Heart: Regular rate and rhythm, no murmurs rubs or gallops.  Abdomen: Bowel sounds are normal, nontender, nondistended, no hepatosplenomegaly or masses, no abdominal bruits or    hernia , no rebound or guarding.   Rectal: Not performed Extremities: No lower extremity edema. No clubbing or deformities.  Neuro: Alert and oriented x 4 , grossly normal neurologically.  Skin: Warm and dry, no rash or jaundice.   Psych: Alert and cooperative, normal mood and affect.

## 2018-02-07 NOTE — Progress Notes (Signed)
Primary Care Physician:  Earney Mallet, MD  Primary Gastroenterologist:  Garfield Cornea, MD   Chief Complaint  Patient presents with  . +Cologuard    never had tcs  . gerd    HPI:  Matthew Butler is a 72 y.o. male here at the request of Dr. Macarthur Critchley to schedule colonoscopy.  Recently had positive Cologuard test.  Patient has never had a colonoscopy.  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  CABG in 3646 complicated by pulmonary embolus.  In August 2018, he had coronary artery stent placement.  He has been on Plavix since that time.  Patient also gives a history of remote bleeding duodenal ulcer.  Was diagnosed with H. pylori and underwent treatment at that time.  Did very well for a long period of time.  For the past 1 year he has had recurrent reflux, sometimes pretty severe.  He believes the symptoms have been since he started Plavix. Wakes up in the middle the night with regurgitation, severe heartburn, choking on acid.  Seems to be worse with certain foods.  Sometimes he has to sleep in a recliner.  Takes pantoprazole in the mornings.  Sometimes as ranitidine at nighttime.  No dysphagia.  No hematemesis.  No melena or rectal bleeding.  Bowel movements are regular.  No abdominal pain.  Patient rarely takes tramadol, last prescription for #40 filled in 2018.  Current Outpatient Medications  Medication Sig Dispense Refill  . aspirin EC 81 MG tablet Take 81 mg by mouth daily.    . brimonidine (ALPHAGAN) 0.2 % ophthalmic solution Place 1 drop into both eyes 3 (three) times daily.  5  . clopidogrel (PLAVIX) 75 MG tablet Take 75 mg by mouth daily.  5  . dorzolamide (TRUSOPT) 2 % ophthalmic solution Place 1 drop into both eyes 3 (three) times daily.  5  . finasteride (PROSCAR) 5 MG tablet Take 5 mg by mouth daily.     . metoprolol succinate (TOPROL-XL) 25 MG 24 hr tablet Take 25 mg by mouth daily.     . ondansetron (ZOFRAN ODT) 4 MG disintegrating  tablet Take 1 tablet (4 mg total) by mouth every 8 (eight) hours as needed for nausea or vomiting. 20 tablet 3  . pantoprazole (PROTONIX) 40 MG tablet Take 1 tablet by mouth daily.    . predniSONE (DELTASONE) 5 MG tablet Take 5 mg by mouth daily.  1  . ranitidine (ZANTAC) 150 MG tablet Take 150 mg by mouth as needed.     . simvastatin (ZOCOR) 40 MG tablet Take 40 mg by mouth at bedtime.     . tamsulosin (FLOMAX) 0.4 MG CAPS capsule Take 0.4 mg by mouth daily.     . traMADol (ULTRAM) 50 MG tablet Take 50 mg by mouth as needed.     No current facility-administered medications for this visit.     Allergies as of 02/07/2018  . (No Known Allergies)    Past Medical History:  Diagnosis Date  . BPH (benign prostatic hyperplasia)   . Coronary artery disease 2011  . GERD (gastroesophageal reflux disease)   . 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 Hodgkin disease ([place])   . HTN (hypertension)   . Hyperlipidemia     Past Surgical History:  Procedure Laterality Date  . APPENDECTOMY    . CHOLECYSTECTOMY N/A 04/18/2014   Procedure: LAPAROSCOPIC CHOLECYSTECTOMY;  Surgeon: Jamesetta So, MD;  Location: [facility];  Service: General;  Laterality: N/A;  . CORONARY ANGIOPLASTY WITH STENT PLACEMENT  2018  . CORONARY ARTERY BYPASS GRAFT  2011  . KNEE ARTHROPLASTY    . MELANOMA EXCISION     Left face  . OTHER SURGICAL HISTORY  1980   Splenectomy    Family History  Problem Relation Age of Onset  . Melanoma Maternal Grandmother   . Congestive Heart Failure Mother   . Deep vein thrombosis Mother   . Bowel Disease Father        Volvulus  . Heart attack Brother   . Diabetes Brother     Social History   Socioeconomic History  . Marital status: Married    Spouse name: Not on file  . Number of children: 1  . Years of education: Not on file  . Highest education level: Not on file  Occupational History  . Occupation: Retired    Comment: Retired Engineer, technical sales  . Financial resource strain: Not on file  . Food insecurity:    Worry: Not on file    Inability: Not on file  . Transportation needs:    Medical: Not on file    Non-medical: Not on file  Tobacco Use  . Smoking status: Former Smoker    Types: Cigars  . Smokeless tobacco: Never Used  Substance and Sexual Activity  . Alcohol use: Not Currently    Alcohol/week: 0.0 standard drinks    Comment: occassional; denied 02/07/18  . Drug use: No  . Sexual activity: Not on file  Lifestyle  . Physical activity:    Days per week: Not on file    Minutes per session: Not on file  . Stress: Not on file  Relationships  . Social connections:    Talks on phone: Not on file    Gets together: Not on file    Attends religious service: Not on file    Active member of club or organization: Not on file    Attends meetings of clubs or organizations: Not on file    Relationship status: Not on file  . Intimate partner violence:    Fear of current or ex partner: Not on file    Emotionally abused: Not on file    Physically abused: Not on file    Forced sexual activity: Not on file  Other Topics Concern  . Not on file  Social History Narrative  . Not on file      ROS:  General: Negative for anorexia, weight loss, fever, chills, fatigue, weakness. Eyes: Negative for vision changes.  ENT: Negative for hoarseness, difficulty swallowing , nasal congestion. CV: Negative for chest pain, angina, palpitations, dyspnea on exertion, peripheral edema.  Respiratory: Negative for dyspnea at rest, dyspnea on exertion, cough, sputum, wheezing.  GI: See history of present illness. GU:  Negative for dysuria, hematuria, urinary incontinence, urinary frequency, nocturnal urination.  MS: Negative for joint pain, low back pain.  Derm: Negative for rash or itching.  Neuro: Negative for weakness, abnormal sensation, seizure, frequent headaches, memory loss, confusion.  Psych: Negative for anxiety,  depression, suicidal ideation, hallucinations.  Endo: Negative for unusual weight change.  Heme: Negative for bruising or bleeding. Allergy: Negative for rash or hives.    Physical Examination:  BP 114/64   Pulse 79   Temp 98.8 F (37.1 C) (Oral)   Ht 5\' 11"  (1.803 m)   Wt 211 lb 12.8 oz (96.1 kg)   BMI 29.54 kg/m  General: Well-nourished, well-developed in no acute distress.  Head: Normocephalic, atraumatic.   Eyes: Conjunctiva pink, no icterus. Mouth: Oropharyngeal mucosa moist and pink , no lesions erythema or exudate. Neck: Supple without thyromegaly, masses, or lymphadenopathy.  Lungs: Clear to auscultation bilaterally.  Heart: Regular rate and rhythm, no murmurs rubs or gallops.  Abdomen: Bowel sounds are normal, nontender, nondistended, no hepatosplenomegaly or masses, no abdominal bruits or    hernia , no rebound or guarding.   Rectal: Not performed Extremities: No lower extremity edema. No clubbing or deformities.  Neuro: Alert and oriented x 4 , grossly normal neurologically.  Skin: Warm and dry, no rash or jaundice.   Psych: Alert and cooperative, normal mood and affect.

## 2018-02-07 NOTE — Assessment & Plan Note (Signed)
Positive Cologuard testing.  No prior colonoscopy.  Recommend colonoscopy in the near future.  Plan for conscious sedation.  I have discussed the risks, alternatives, benefits with regards to but not limited to the risk of reaction to medication, bleeding, infection, perforation and the patient is agreeable to proceed. Written consent to be obtained.

## 2018-02-07 NOTE — Assessment & Plan Note (Signed)
Remote history of duodenal ulcer secondary to H. pylori.  The past 1 year he has had increasing difficulty controlling his reflux. Controlled at times on pantoprazole but complains with nocturnal breakthrough symptoms associated with regurgitation.  Seems to be worse with certain foods.  Will increase pantoprazole to twice daily before meals, breakfast and evening meal.  If no significant improvement in symptoms, would recommend upper endoscopy at that time.  We will have him come back for evaluation in April when he returns from [place] but if he has any problems in the interim he should call sooner.  Patient in agreement with plan.

## 2018-02-08 NOTE — Progress Notes (Signed)
cc'd to pcp 

## 2018-02-28 ENCOUNTER — Encounter (HOSPITAL_COMMUNITY)
Admission: RE | Disposition: A | Discharge status: Home / Self Care | Payer: Self-pay | Source: Ambulatory Visit | Attending: Internal Medicine

## 2018-02-28 ENCOUNTER — Ambulatory Visit (HOSPITAL_COMMUNITY)
Admission: RE | Admit: 2018-02-28 | Discharge: 2018-02-28 | Disposition: A | Discharge status: Home / Self Care | Payer: Medicare Other | Source: Ambulatory Visit | Attending: Internal Medicine | Admitting: Internal Medicine

## 2018-02-28 ENCOUNTER — Encounter (HOSPITAL_COMMUNITY): Payer: Self-pay | Admitting: *Deleted

## 2018-02-28 ENCOUNTER — Other Ambulatory Visit: Payer: Self-pay

## 2018-02-28 DIAGNOSIS — Z86711 Personal history of pulmonary embolism: Secondary | ICD-10-CM | POA: Diagnosis not present

## 2018-02-28 DIAGNOSIS — D124 Benign neoplasm of descending colon: Secondary | ICD-10-CM

## 2018-02-28 DIAGNOSIS — I2510 Atherosclerotic heart disease of native coronary artery without angina pectoris: Secondary | ICD-10-CM | POA: Insufficient documentation

## 2018-02-28 DIAGNOSIS — K5730 Diverticulosis of large intestine without perforation or abscess without bleeding: Secondary | ICD-10-CM | POA: Diagnosis not present

## 2018-02-28 DIAGNOSIS — K219 Gastro-esophageal reflux disease without esophagitis: Secondary | ICD-10-CM | POA: Diagnosis not present

## 2018-02-28 DIAGNOSIS — I10 Essential (primary) hypertension: Secondary | ICD-10-CM | POA: Insufficient documentation

## 2018-02-28 DIAGNOSIS — E785 Hyperlipidemia, unspecified: Secondary | ICD-10-CM | POA: Diagnosis not present

## 2018-02-28 DIAGNOSIS — N400 Enlarged prostate without lower urinary tract symptoms: Secondary | ICD-10-CM | POA: Diagnosis not present

## 2018-02-28 DIAGNOSIS — Z7902 Long term (current) use of antithrombotics/antiplatelets: Secondary | ICD-10-CM | POA: Diagnosis not present

## 2018-02-28 DIAGNOSIS — Z9081 Acquired absence of spleen: Secondary | ICD-10-CM | POA: Diagnosis not present

## 2018-02-28 DIAGNOSIS — D123 Benign neoplasm of transverse colon: Secondary | ICD-10-CM

## 2018-02-28 DIAGNOSIS — Z955 Presence of coronary angioplasty implant and graft: Secondary | ICD-10-CM | POA: Insufficient documentation

## 2018-02-28 DIAGNOSIS — Z87891 Personal history of nicotine dependence: Secondary | ICD-10-CM | POA: Diagnosis not present

## 2018-02-28 DIAGNOSIS — Z7982 Long term (current) use of aspirin: Secondary | ICD-10-CM | POA: Diagnosis not present

## 2018-02-28 DIAGNOSIS — Z8249 Family history of ischemic heart disease and other diseases of the circulatory system: Secondary | ICD-10-CM | POA: Insufficient documentation

## 2018-02-28 DIAGNOSIS — Z7952 Long term (current) use of systemic steroids: Secondary | ICD-10-CM | POA: Insufficient documentation

## 2018-02-28 DIAGNOSIS — K635 Polyp of colon: Secondary | ICD-10-CM | POA: Diagnosis not present

## 2018-02-28 DIAGNOSIS — Z79899 Other long term (current) drug therapy: Secondary | ICD-10-CM | POA: Insufficient documentation

## 2018-02-28 DIAGNOSIS — Z85820 Personal history of malignant melanoma of skin: Secondary | ICD-10-CM | POA: Insufficient documentation

## 2018-02-28 DIAGNOSIS — Z8571 Personal history of Hodgkin lymphoma: Secondary | ICD-10-CM | POA: Insufficient documentation

## 2018-02-28 DIAGNOSIS — D125 Benign neoplasm of sigmoid colon: Secondary | ICD-10-CM

## 2018-02-28 DIAGNOSIS — Z923 Personal history of irradiation: Secondary | ICD-10-CM | POA: Insufficient documentation

## 2018-02-28 DIAGNOSIS — K621 Rectal polyp: Secondary | ICD-10-CM | POA: Diagnosis not present

## 2018-02-28 DIAGNOSIS — Z951 Presence of aortocoronary bypass graft: Secondary | ICD-10-CM | POA: Insufficient documentation

## 2018-02-28 DIAGNOSIS — R195 Other fecal abnormalities: Secondary | ICD-10-CM | POA: Diagnosis not present

## 2018-02-28 HISTORY — PX: POLYPECTOMY: SHX5525

## 2018-02-28 HISTORY — PX: COLONOSCOPY: SHX5424

## 2018-02-28 SURGERY — COLONOSCOPY
Anesthesia: Moderate Sedation

## 2018-02-28 MED ORDER — MEPERIDINE HCL 100 MG/ML IJ SOLN
INTRAMUSCULAR | Status: DC | PRN
  Administered 2018-02-28: 25 mg

## 2018-02-28 MED ORDER — STERILE WATER FOR IRRIGATION IR SOLN
Status: DC | PRN
  Administered 2018-02-28: 1.5 mL

## 2018-02-28 MED ORDER — MEPERIDINE HCL 50 MG/ML IJ SOLN
INTRAMUSCULAR | Status: AC
  Filled 2018-02-28: qty 1

## 2018-02-28 MED ORDER — ONDANSETRON HCL 4 MG/2ML IJ SOLN
INTRAMUSCULAR | Status: DC | PRN
  Administered 2018-02-28: 4 mg via INTRAVENOUS

## 2018-02-28 MED ORDER — MIDAZOLAM HCL 5 MG/5ML IJ SOLN
INTRAMUSCULAR | Status: AC
  Filled 2018-02-28: qty 10

## 2018-02-28 MED ORDER — MIDAZOLAM HCL 5 MG/5ML IJ SOLN
INTRAMUSCULAR | Status: DC | PRN
  Administered 2018-02-28 (×2): 1 mg via INTRAVENOUS

## 2018-02-28 MED ORDER — ONDANSETRON HCL 4 MG/2ML IJ SOLN
INTRAMUSCULAR | Status: AC
  Filled 2018-02-28: qty 2

## 2018-02-28 MED ORDER — SODIUM CHLORIDE 0.9 % IV SOLN
INTRAVENOUS | Status: DC
  Administered 2018-02-28: 07:00:00 via INTRAVENOUS

## 2018-02-28 NOTE — Op Note (Signed)
[facility] Patient Name: Matthew Butler Procedure Date: 02/28/2018 7:07 AM MRN: [id number] Date of Birth: July 19, 1945 Attending MD: Norvel Richards , MD CSN: [id number] Age: 72 Admit Type: Outpatient Procedure:                Colonoscopy Indications:              Positive Cologuard test Providers:                Norvel Richards, MD, Gerome Sam, RN,                            Aram Candela Referring MD:              Medicines:                Midazolam 2 mg IV, Meperidine 25 mg IV, Ondansetron                            4 mg IV Complications:            No immediate complications. Estimated Blood Loss:     Estimated blood loss: none. Procedure:                Pre-Anesthesia Assessment:                           - Prior to the procedure, a History and Physical                            was performed, and patient medications and                            allergies were reviewed. The patient's tolerance of                            previous anesthesia was also reviewed. The risks                            and benefits of the procedure and the sedation                            options and risks were discussed with the patient.                            All questions were answered, and informed consent                            was obtained. Prior Anticoagulants: The patient has                            taken no previous anticoagulant or antiplatelet                            agents. ASA Grade Assessment: II - A patient with  mild systemic disease. After reviewing the risks                            and benefits, the patient was deemed in                            satisfactory condition to undergo the procedure.                           After obtaining informed consent, the colonoscope                            was passed under direct vision. Throughout the                            procedure, the patient's blood pressure, pulse,  and                            oxygen saturations were monitored continuously. The                            CF-HQ190L ([id number]) scope was introduced through                            the anus and advanced to the the cecum, identified                            by appendiceal orifice and ileocecal valve. The                            colonoscopy was performed without difficulty. The                            patient tolerated the procedure well. The quality                            of the bowel preparation was adequate. The                            ileocecal valve, appendiceal orifice, and rectum                            were photographed. The entire colon was well                            visualized. The quality of the bowel preparation                            was adequate. Scope In: 7:43:17 AM Scope Out: 8:12:17 AM Scope Withdrawal Time: 0 hours 24 minutes 24 seconds  Total Procedure Duration: 0 hours 29 minutes 0 seconds  Findings:      The perianal and digital rectal examinations were normal.      Scattered medium-mouthed diverticula were found in the sigmoid colon and  descending colon.      Eight semi-pedunculated polyps were found in the rectum, descending,       sigmoid colon and hepatic flexure. The polyps were 5 to 7 mm in size.       These polyps were removed with a cold snare. Resection and retrieval       were complete. Estimated blood loss was minimal.      A 12 mm polyp was found in the sigmoid colon. The polyp was       pedunculated. The polyp was removed with a hot snare. Resection and       retrieval were complete. Estimated blood loss: none. Polypectomy site       closed with 3 hemostasis clips.      The exam was otherwise without abnormality on direct and retroflexion       views. Impression:               - Moderate diverticulosis in the sigmoid colon and                            in the descending colon.                           - Eight 5 to  7 mm polyps in the rectum, in the                            descending colon and at the hepatic flexure,                            removed with a cold snare. Resected and retrieved.                           - One 12 mm polyp in the sigmoid colon, removed                            with a hot snare. Resected and retrieved.                           - The examination was otherwise normal on direct                            and retroflexion views. Moderate Sedation:      Moderate (conscious) sedation was administered by the endoscopy nurse       and supervised by the endoscopist. The following parameters were       monitored: oxygen saturation, heart rate, blood pressure, respiratory       rate, EKG, adequacy of pulmonary ventilation, and response to care.       Total physician intraservice time was 37 minutes. Recommendation:           - Patient has a contact number available for                            emergencies. The signs and symptoms of potential                            delayed  complications were discussed with the                            patient. Return to normal activities tomorrow.                            Written discharge instructions were provided to the                            patient.                           - Resume previous diet.                           - Continue present medications.                           - Await pathology results.                           - Repeat colonoscopy date to be determined after                            pending pathology results are reviewed for                            surveillance based on pathology results.                           - Return to GI office (date not yet determined). Procedure Code(s):        --- Professional ---                           [phone], Colonoscopy, flexible; with removal of                            tumor(s), polyp(s), or other lesion(s) by snare                            technique                            99153, Moderate sedation; each additional 15                            minutes intraservice time                           G0500, Moderate sedation services provided by the                            same physician or other qualified health care                            professional performing a gastrointestinal  endoscopic service that sedation supports,                            requiring the presence of an independent trained                            observer to assist in the monitoring of the                            patient's level of consciousness and physiological                            status; initial 15 minutes of intra-service time;                            patient age 20 years or older (additional time may                            be reported with [phone], as appropriate) Diagnosis Code(s):        --- Professional ---                           K62.1, Rectal polyp                           D12.4, Benign neoplasm of descending colon                           D12.3, Benign neoplasm of transverse colon (hepatic                            flexure or splenic flexure)                           D12.5, Benign neoplasm of sigmoid colon                           R19.5, Other fecal abnormalities                           K57.30, Diverticulosis of large intestine without                            perforation or abscess without bleeding CPT copyright 2018 American Medical Association. All rights reserved. The codes documented in this report are preliminary and upon coder review may  be revised to meet current compliance requirements. [name]. Cheyanna Strick, MD Norvel Richards, MD 02/28/2018 8:38:43 AM This report has been signed electronically. Number of Addenda: 0

## 2018-02-28 NOTE — Discharge Instructions (Signed)
Colonoscopy Discharge Instructions  Read the instructions outlined below and refer to this sheet in the next few weeks. These discharge instructions provide you with general information on caring for yourself after you leave the hospital. Your doctor may also give you specific instructions. While your treatment has been planned according to the most current medical practices available, unavoidable complications occasionally occur. If you have any problems or questions after discharge, call Dr. Gala Romney at [phone]. ACTIVITY  You may resume your regular activity, but move at a slower pace for the next 24 hours.   Take frequent rest periods for the next 24 hours.   Walking will help get rid of the air and reduce the bloated feeling in your belly (abdomen).   No driving for 24 hours (because of the medicine (anesthesia) used during the test).    Do not sign any important legal documents or operate any machinery for 24 hours (because of the anesthesia used during the test).  NUTRITION  Drink plenty of fluids.   You may resume your normal diet as instructed by your doctor.   Begin with a light meal and progress to your normal diet. Heavy or fried foods are harder to digest and may make you feel sick to your stomach (nauseated).   Avoid alcoholic beverages for 24 hours or as instructed.  MEDICATIONS  You may resume your normal medications unless your doctor tells you otherwise.  WHAT YOU CAN EXPECT TODAY  Some feelings of bloating in the abdomen.   Passage of more gas than usual.   Spotting of blood in your stool or on the toilet paper.  IF YOU HAD POLYPS REMOVED DURING THE COLONOSCOPY:  No aspirin products for 7 days or as instructed.   No alcohol for 7 days or as instructed.   Eat a soft diet for the next 24 hours.  FINDING OUT THE RESULTS OF YOUR TEST Not all test results are available during your visit. If your test results are not back during the visit, make an appointment  with your caregiver to find out the results. Do not assume everything is normal if you have not heard from your caregiver or the medical facility. It is important for you to follow up on all of your test results.  SEEK IMMEDIATE MEDICAL ATTENTION IF:  You have more than a spotting of blood in your stool.   Your belly is swollen (abdominal distention).   You are nauseated or vomiting.   You have a temperature over 101.   You have abdominal pain or discomfort that is severe or gets worse throughout the day.   Colon Polyps Polyps are tissue growths inside the body. Polyps can grow in many places, including the large intestine (colon). A polyp may be a round bump or a mushroom-shaped growth. You could have one polyp or several. Most colon polyps are noncancerous (benign). However, some colon polyps can become cancerous over time. What are the causes? The exact cause of colon polyps is not known. What increases the risk? This condition is more likely to develop in people who:  Have a family history of colon cancer or colon polyps.  Are older than 29 or older than 45 if they are African American.  Have inflammatory bowel disease, such as ulcerative colitis or Crohn disease.  Are overweight.  Smoke cigarettes.  Do not get enough exercise.  Drink too much alcohol.  Eat a diet that is: ? High in fat and red meat. ? Low in fiber.  Had childhood cancer that was treated with abdominal radiation.  What are the signs or symptoms? Most polyps do not cause symptoms. If you have symptoms, they may include:  Blood coming from your rectum when having a bowel movement.  Blood in your stool.The stool may look dark red or black.  A change in bowel habits, such as constipation or diarrhea.  How is this diagnosed? This condition is diagnosed with a colonoscopy. This is a procedure that uses a lighted, flexible scope to look at the inside of your colon. How is this treated? Treatment for  this condition involves removing any polyps that are found. Those polyps will then be tested for cancer. If cancer is found, your health care provider will talk to you about options for colon cancer treatment. Follow these instructions at home: Diet  Eat plenty of fiber, such as fruits, vegetables, and whole grains.  Eat foods that are high in calcium and vitamin D, such as milk, cheese, yogurt, eggs, liver, fish, and broccoli.  Limit foods high in fat, red meats, and processed meats, such as hot dogs, sausage, bacon, and lunch meats.  Maintain a healthy weight, or lose weight if recommended by your health care provider. General instructions  Do not smoke cigarettes.  Do not drink alcohol excessively.  Keep all follow-up visits as told by your health care provider. This is important. This includes keeping regularly scheduled colonoscopies. Talk to your health care provider about when you need a colonoscopy.  Exercise every day or as told by your health care provider. Contact a health care provider if:  You have new or worsening bleeding during a bowel movement.  You have new or increased blood in your stool.  You have a change in bowel habits.  You unexpectedly lose weight. This information is not intended to replace advice given to you by your health care provider. Make sure you discuss any questions you have with your health care provider. Document Released: 12/16/2003 Document Revised: 08/27/2015 Document Reviewed: 02/09/2015 Elsevier Interactive Patient Education  2018 [employer].   Colon polyp information provided  No MRI until clips gone  I would expect you to pass a little  Blood with your next couple of bowel Movements.  However, it should rapidly taper off.  Further recommendations to follow pending review of pathology report

## 2018-02-28 NOTE — Interval H&P Note (Signed)
History and Physical Interval Note:  02/28/2018 7:31 AM  Matthew Butler  has presented today for surgery, with the diagnosis of + cologuard  The various methods of treatment have been discussed with the patient and family. After consideration of risks, benefits and other options for treatment, the patient has consented to  Procedure(s) with comments: COLONOSCOPY (N/A) - 7:30am as a surgical intervention .  The patient's history has been reviewed, patient examined, no change in status, stable for surgery.  I have reviewed the patient's chart and labs.  Questions were answered to the patient's satisfaction.     Evette Diclemente    No change. Diagnostic colonoscopy per  Plan.  The risks, benefits, limitations, alternatives and imponderables have been reviewed with the patient. Questions have been answered. All parties are agreeable.

## 2018-03-02 ENCOUNTER — Encounter: Payer: Self-pay | Admitting: Internal Medicine

## 2018-03-07 ENCOUNTER — Other Ambulatory Visit: Payer: Self-pay

## 2018-03-07 MED ORDER — PANTOPRAZOLE SODIUM 40 MG PO TBEC: 40.0000 mg | DELAYED_RELEASE_TABLET | Freq: Two times a day (BID) | ORAL | 3 refills | Status: DC

## 2018-03-08 ENCOUNTER — Telehealth: Payer: Self-pay | Admitting: Internal Medicine

## 2018-03-08 ENCOUNTER — Encounter (HOSPITAL_COMMUNITY): Payer: Self-pay | Admitting: Internal Medicine

## 2018-03-08 NOTE — Telephone Encounter (Signed)
Pt was calling to see if his results were back. I told him AM was off today and [facility] had mailed him a letter about his results. I read the letter to him and he is aware to repeat colonoscopy in 3 yrs.

## 2018-03-09 ENCOUNTER — Other Ambulatory Visit: Payer: Self-pay

## 2018-03-09 MED ORDER — PANTOPRAZOLE SODIUM 40 MG PO TBEC: 40.0000 mg | DELAYED_RELEASE_TABLET | Freq: Two times a day (BID) | ORAL | 3 refills | Status: DC

## 2018-03-09 NOTE — Telephone Encounter (Signed)
Noted  

## 2018-05-04 ENCOUNTER — Ambulatory Visit: Payer: Medicare Other | Admitting: Gastroenterology

## 2018-07-09 ENCOUNTER — Ambulatory Visit: Payer: Medicare Other | Admitting: Gastroenterology

## 2019-02-18 ENCOUNTER — Other Ambulatory Visit: Payer: Self-pay | Admitting: Gastroenterology

## 2019-11-30 ENCOUNTER — Other Ambulatory Visit: Payer: Self-pay | Admitting: Gastroenterology

## 2019-12-04 ENCOUNTER — Encounter: Payer: Self-pay | Admitting: Internal Medicine

## 2019-12-04 NOTE — Telephone Encounter (Signed)
Refills provided but needs office visit prior to any further.

## 2020-01-29 ENCOUNTER — Ambulatory Visit (INDEPENDENT_AMBULATORY_CARE_PROVIDER_SITE_OTHER): Payer: Medicare Other | Admitting: Gastroenterology

## 2020-01-29 ENCOUNTER — Other Ambulatory Visit: Payer: Self-pay

## 2020-01-29 ENCOUNTER — Encounter: Payer: Self-pay | Admitting: Gastroenterology

## 2020-01-29 VITALS — BP 140/68 | HR 72 | Temp 97.5°F | Ht 69.0 in | Wt 210.8 lb

## 2020-01-29 DIAGNOSIS — K219 Gastro-esophageal reflux disease without esophagitis: Secondary | ICD-10-CM | POA: Diagnosis not present

## 2020-01-29 DIAGNOSIS — R1013 Epigastric pain: Secondary | ICD-10-CM

## 2020-01-29 MED ORDER — ESOMEPRAZOLE MAGNESIUM 40 MG PO CPDR: 40.0000 mg | DELAYED_RELEASE_CAPSULE | Freq: Two times a day (BID) | ORAL | 5 refills | Status: DC

## 2020-01-29 NOTE — Progress Notes (Signed)
Primary Care Physician: Earney Mallet, MD  Primary Gastroenterologist:  Garfield Cornea, MD   Chief Complaint  Patient presents with  . Gastroesophageal Reflux    acid comes back up in throat when flat  . Diarrhea    COMES/GOES, few times per week depending on what he eats    HPI: Matthew Butler is a 74 y.o. male here for further evaluation of reflux.  Patient last seen in November 2019.  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  CABG in 3329 complicated by pulmonary embolus.  August 2018 he had coronary artery stent placement.  Patient has history of remote bleeding duodenal ulcer.  Diagnosed with H. pylori and underwent treatment at that time.  When he was seen back in 2019, we increased his pantoprazole to twice daily at that time and gave instructions to consider upper endoscopy if refractory symptoms.  Patient presents back with complaints of frequent refractory reflux symptoms, particularly at night.  Tries to eat his last meal of the day around 5 PM but not always able to do that.  Typically does eat snacks in the evenings. Nocturnal regurgitation/heartburn and has to get in recliner for several hours. Sometimes starts out in the chair because knows its coming.  Has been on pantoprazole twice daily but often misses evening dose.  When he has bad heartburn he takes baking soda, tums.  Recently bought over-the-counter Nexium because Nexium used to work well for him in the past.  Currently taking Nexium and pantoprazole every morning and most evenings takes pantoprazole as well.  Over the past several weeks he has had some postprandial epigastric pain, nausea.  Occasional vomiting.  Bowel movements are regular.  No blood in stool or melena.  Takes ibuprofen 400 to 600 mg on occasion for back pain but not daily.  On low-dose prednisone for back pain.  Aspirin 81 mg daily.  No EGD before.  Up-to-date on colonoscopy, due next November 2022 for  multiple tubular adenomas.  Current Outpatient Medications  Medication Sig Dispense Refill  . aspirin EC 81 MG tablet Take 81 mg by mouth daily.    Marland Kitchen esomeprazole (NEXIUM) 20 MG capsule Take 20 mg by mouth daily.     . metoprolol succinate (TOPROL-XL) 25 MG 24 hr tablet Take 25 mg by mouth daily.     . nitroGLYCERIN (NITROSTAT) 0.4 MG SL tablet Place 0.4 mg under the tongue every 5 (five) minutes as needed for chest pain.    . pantoprazole (PROTONIX) 40 MG tablet TAKE 1 TABLET (40 MG TOTAL) BY MOUTH 2 (TWO) TIMES DAILY BEFORE A MEAL. 60 tablet 1  . predniSONE (DELTASONE) 5 MG tablet Take 5 mg by mouth daily.  1  . simvastatin (ZOCOR) 40 MG tablet Take 40 mg by mouth at bedtime.     . tamsulosin (FLOMAX) 0.4 MG CAPS capsule Take 0.4 mg by mouth daily.      No current facility-administered medications for this visit.    Allergies as of 01/29/2020  . (No Known Allergies)   Past Medical History:  Diagnosis Date  . BPH (benign prostatic hyperplasia)   . Coronary artery disease 2011  . GERD (gastroesophageal reflux disease)   . 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 Hodgkin disease ([place])    1980s, status post splenectomy and radiation  . HTN (hypertension)   . Hyperlipidemia    Past Surgical History:  Procedure Laterality Date  . APPENDECTOMY    . CHOLECYSTECTOMY N/A 04/18/2014   Procedure: LAPAROSCOPIC CHOLECYSTECTOMY;  Surgeon: Jamesetta So, MD;  Location: [facility];  Service: General;  Laterality: N/A;  . COLONOSCOPY N/A 02/28/2018   [place]: Diverticulosis.  Numerous tubular adenomas removed.  Surveillance colonoscopy advised for November 2022.  Marland Kitchen CORONARY ANGIOPLASTY WITH STENT PLACEMENT  2018  . CORONARY ARTERY BYPASS GRAFT  2011  . KNEE ARTHROPLASTY    . MELANOMA EXCISION  2016   Left face  . POLYPECTOMY  02/28/2018   Procedure: POLYPECTOMY;  Surgeon: [name], MD;  Location: [facility];  Service: Endoscopy;;  (colon)  .  SPLENECTOMY  1980   For Hodgkin's lymphoma   Family History  Problem Relation Age of Onset  . Melanoma Maternal Grandmother   . Congestive Heart Failure Mother   . Deep vein thrombosis Mother   . Bowel Disease Father        [place]  . Heart attack Brother   . Diabetes Brother    Social History   Tobacco Use  . Smoking status: Former Smoker    Types: Cigars  . Smokeless tobacco: Never Used  Substance Use Topics  . Alcohol use: Not Currently    Alcohol/week: 0.0 standard drinks    Comment: occassional; denied 02/07/18  . Drug use: No    ROS:  General: Negative for anorexia, weight loss, fever, chills, fatigue, weakness. ENT: Negative for hoarseness, difficulty swallowing , nasal congestion. CV: Negative for chest pain, angina, palpitations, dyspnea on exertion, peripheral edema.  Respiratory: Negative for dyspnea at rest, dyspnea on exertion, cough, sputum, wheezing.  GI: See history of present illness. GU:  Negative for dysuria, hematuria, urinary incontinence, urinary frequency, nocturnal urination.  Endo: Negative for unusual weight change.    Physical Examination:   BP 140/68   Pulse 72   Temp (!) 97.5 F (36.4 C)   Ht 5\' 9"  (1.753 m)   Wt 210 lb 12.8 oz (95.6 kg)   BMI 31.13 kg/m   General: Well-nourished, well-developed in no acute distress.  Accompanied by wife. Eyes: No icterus. Mouth: masked. Lungs: Clear to auscultation bilaterally.  Heart: Regular rate and rhythm, no murmurs rubs or gallops.  Abdomen: Bowel sounds are normal, nontender, nondistended, no hepatosplenomegaly or masses, no abdominal bruits or hernia , no rebound or guarding.   Extremities: No lower extremity edema. No clubbing or deformities. Neuro: Alert and oriented x 4   Skin: Warm and dry, no jaundice.   Psych: Alert and cooperative, normal mood and affect.   Imaging Studies: No results found.  Impression/plan:  74 year old male with chronic reflux, refractory/difficult to  manage symptoms.  Frequent breakthrough heartburn/regurgitation when he lays down at night.  Sleeping in the recliner on a regular basis.  Some reflux during the day.  No dysphagia.  Recently started having postprandial epigastric pain.  Some NSAID use.  Daily aspirin and prednisone.  Recommend endoscopy in the near future with Dr. Gala Romney with conscious sedation to evaluate for esophagitis, Barrett's, gastritis/peptic ulcer disease. ASA III.  I have discussed the risks, alternatives, benefits with regards to but not limited to the risk of reaction to medication, bleeding, infection, perforation and the patient is agreeable to proceed. Written consent to be obtained.  Reinforced antireflux measures.  Stop over-the-counter Nexium and pantoprazole.  Add Nexium 40 mg twice daily before meals.

## 2020-01-29 NOTE — Patient Instructions (Signed)
1. Stop pantoprazole and over the counter Nexium. 2. Start prescription Nexium 40mg  twice daily 30-60 minutes before breakfast and evening meal. RX sent to your pharmacy. 3. Upper endoscopy as scheduled. See separate instructions.   Gastroesophageal Reflux Disease, Adult Gastroesophageal reflux (GER) happens when acid from the stomach flows up into the tube that connects the mouth and the stomach (esophagus). Normally, food travels down the esophagus and stays in the stomach to be digested. However, when a person has GER, food and stomach acid sometimes move back up into the esophagus. If this becomes a more serious problem, the person may be diagnosed with a disease called gastroesophageal reflux disease (GERD). GERD occurs when the reflux:  Happens often.  Causes frequent or severe symptoms.  Causes problems such as damage to the esophagus. When stomach acid comes in contact with the esophagus, the acid may cause soreness (inflammation) in the esophagus. Over time, GERD may create small holes (ulcers) in the lining of the esophagus. What are the causes? This condition is caused by a problem with the muscle between the esophagus and the stomach (lower esophageal sphincter, or LES). Normally, the LES muscle closes after food passes through the esophagus to the stomach. When the LES is weakened or abnormal, it does not close properly, and that allows food and stomach acid to go back up into the esophagus. The LES can be weakened by certain dietary substances, medicines, and medical conditions, including:  Tobacco use.  Pregnancy.  Having a hiatal hernia.  Alcohol use.  Certain foods and beverages, such as coffee, chocolate, onions, and peppermint. What increases the risk? You are more likely to develop this condition if you:  Have an increased body weight.  Have a connective tissue disorder.  Use NSAID medicines. What are the signs or symptoms? Symptoms of this condition  include:  Heartburn.  Difficult or painful swallowing.  The feeling of having a lump in the throat.  Abitter taste in the mouth.  Bad breath.  Having a large amount of saliva.  Having an upset or bloated stomach.  Belching.  Chest pain. Different conditions can cause chest pain. Make sure you see your health care provider if you experience chest pain.  Shortness of breath or wheezing.  Ongoing (chronic) cough or a night-time cough.  Wearing away of tooth enamel.  Weight loss. How is this diagnosed? Your health care provider will take a medical history and perform a physical exam. To determine if you have mild or severe GERD, your health care provider may also monitor how you respond to treatment. You may also have tests, including:  A test to examine your stomach and esophagus with a small camera (endoscopy).  A test thatmeasures the acidity level in your esophagus.  A test thatmeasures how much pressure is on your esophagus.  A barium swallow or modified barium swallow test to show the shape, size, and functioning of your esophagus. How is this treated? The goal of treatment is to help relieve your symptoms and to prevent complications. Treatment for this condition may vary depending on how severe your symptoms are. Your health care provider may recommend:  Changes to your diet.  Medicine.  Surgery. Follow these instructions at home: Eating and drinking   Follow a diet as recommended by your health care provider. This may involve avoiding foods and drinks such as: ? Coffee and tea (with or without caffeine). ? Drinks that containalcohol. ? Energy drinks and sports drinks. ? Carbonated drinks or sodas. ?  Chocolate and cocoa. ? Peppermint and mint flavorings. ? Garlic and onions. ? Horseradish. ? Spicy and acidic foods, including peppers, chili powder, curry powder, vinegar, hot sauces, and barbecue sauce. ? Citrus fruit juices and citrus fruits, such as  oranges, lemons, and limes. ? Tomato-based foods, such as red sauce, chili, salsa, and pizza with red sauce. ? Fried and fatty foods, such as donuts, french fries, potato chips, and high-fat dressings. ? High-fat meats, such as hot dogs and fatty cuts of red and white meats, such as rib eye steak, sausage, ham, and bacon. ? High-fat dairy items, such as whole milk, butter, and cream cheese.  Eat small, frequent meals instead of large meals.  Avoid drinking large amounts of liquid with your meals.  Avoid eating meals during the 2-3 hours before bedtime.  Avoid lying down right after you eat.  Do not exercise right after you eat. Lifestyle   Do not use any products that contain nicotine or tobacco, such as cigarettes, e-cigarettes, and chewing tobacco. If you need help quitting, ask your health care provider.  Try to reduce your stress by using methods such as yoga or meditation. If you need help reducing stress, ask your health care provider.  If you are overweight, reduce your weight to an amount that is healthy for you. Ask your health care provider for guidance about a safe weight loss goal. General instructions  Pay attention to any changes in your symptoms.  Take over-the-counter and prescription medicines only as told by your health care provider. Do not take aspirin, ibuprofen, or other NSAIDs unless your health care provider told you to do so.  Wear loose-fitting clothing. Do not wear anything tight around your waist that causes pressure on your abdomen.  Raise (elevate) the head of your bed about 6 inches (15 cm).  Avoid bending over if this makes your symptoms worse.  Keep all follow-up visits as told by your health care provider. This is important. Contact a health care provider if:  You have: ? New symptoms. ? Unexplained weight loss. ? Difficulty swallowing or it hurts to swallow. ? Wheezing or a persistent cough. ? A hoarse voice.  Your symptoms do not  improve with treatment. Get help right away if you:  Have pain in your arms, neck, jaw, teeth, or back.  Feel sweaty, dizzy, or light-headed.  Have chest pain or shortness of breath.  Vomit and your vomit looks like blood or coffee grounds.  Faint.  Have stool that is bloody or black.  Cannot swallow, drink, or eat. Summary  Gastroesophageal reflux happens when acid from the stomach flows up into the esophagus. GERD is a disease in which the reflux happens often, causes frequent or severe symptoms, or causes problems such as damage to the esophagus.  Treatment for this condition may vary depending on how severe your symptoms are. Your health care provider may recommend diet and lifestyle changes, medicine, or surgery.  Contact a health care provider if you have new or worsening symptoms.  Take over-the-counter and prescription medicines only as told by your health care provider. Do not take aspirin, ibuprofen, or other NSAIDs unless your health care provider told you to do so.  Keep all follow-up visits as told by your health care provider. This is important. This information is not intended to replace advice given to you by your health care provider. Make sure you discuss any questions you have with your health care provider. Document Revised: 09/27/2017 Document Reviewed: 09/27/2017  Elsevier Patient Education  2020 [employer] for Gastroesophageal Reflux Disease, Adult When you have gastroesophageal reflux disease (GERD), the foods you eat and your eating habits are very important. Choosing the right foods can help ease the discomfort of GERD. Consider working with a diet and nutrition specialist (dietitian) to help you make healthy food choices. What general guidelines should I follow?  Eating plan  Choose healthy foods low in fat, such as fruits, vegetables, whole grains, low-fat dairy products, and lean meat, fish, and poultry.  Eat frequent, small  meals instead of three large meals each day. Eat your meals slowly, in a relaxed setting. Avoid bending over or lying down until 2-3 hours after eating.  Limit high-fat foods such as fatty meats or fried foods.  Limit your intake of oils, butter, and shortening to less than 8 teaspoons each day.  Avoid the following: ? Foods that cause symptoms. These may be different for different people. Keep a food diary to keep track of foods that cause symptoms. ? Alcohol. ? Drinking large amounts of liquid with meals. ? Eating meals during the 2-3 hours before bed.  Cook foods using methods other than frying. This may include baking, grilling, or broiling. Lifestyle  Maintain a healthy weight. Ask your health care provider what weight is healthy for you. If you need to lose weight, work with your health care provider to do so safely.  Exercise for at least 30 minutes on 5 or more days each week, or as told by your health care provider.  Avoid wearing clothes that fit tightly around your waist and chest.  Do not use any products that contain nicotine or tobacco, such as cigarettes and e-cigarettes. If you need help quitting, ask your health care provider.  Sleep with the head of your bed raised. Use a wedge under the mattress or blocks under the bed frame to raise the head of the bed. What foods are not recommended? The items listed may not be a complete list. Talk with your dietitian about what dietary choices are best for you. Grains Pastries or quick breads with added fat. [place] toast. Vegetables Deep fried vegetables. [place] fries. Any vegetables prepared with added fat. Any vegetables that cause symptoms. For some people this may include tomatoes and tomato products, chili peppers, onions and garlic, and horseradish. Fruits Any fruits prepared with added fat. Any fruits that cause symptoms. For some people this may include citrus fruits, such as oranges, grapefruit, pineapple, and  lemons. Meats and other protein foods High-fat meats, such as fatty beef or pork, hot dogs, ribs, ham, sausage, salami and bacon. Fried meat or protein, including fried fish and fried chicken. Nuts and nut butters. Dairy Whole milk and chocolate milk. Sour cream. Cream. Ice cream. Cream cheese. Milk shakes. Beverages Coffee and tea, with or without caffeine. Carbonated beverages. Sodas. Energy drinks. Fruit juice made with acidic fruits (such as orange or grapefruit). Tomato juice. Alcoholic drinks. Fats and oils Butter. Margarine. Shortening. Ghee. Sweets and desserts Chocolate and cocoa. Donuts. Seasoning and other foods Pepper. Peppermint and spearmint. Any condiments, herbs, or seasonings that cause symptoms. For some people, this may include curry, hot sauce, or vinegar-based salad dressings. Summary  When you have gastroesophageal reflux disease (GERD), food and lifestyle choices are very important to help ease the discomfort of GERD.  Eat frequent, small meals instead of three large meals each day. Eat your meals slowly, in a relaxed setting. Avoid bending over  or lying down until 2-3 hours after eating.  Limit high-fat foods such as fatty meat or fried foods. This information is not intended to replace advice given to you by your health care provider. Make sure you discuss any questions you have with your health care provider. Document Revised: 07/12/2018 Document Reviewed: 03/22/2016 Elsevier Patient Education  [place].

## 2020-02-12 ENCOUNTER — Encounter: Payer: Self-pay | Admitting: *Deleted

## 2020-02-12 ENCOUNTER — Telehealth: Payer: Self-pay | Admitting: *Deleted

## 2020-02-12 NOTE — Telephone Encounter (Signed)
Called pt and informed him that his procedure on 02/18/2020 needed to be rescheduled due to conflict in procedure schedules.  Pt rescheduled his Covid screening to 03/04/2020 at 10:00.  He rescheduled his procedure to 03/06/2020 at 11:00 with arrival at 10:00.  Pt made aware that I will mail out new prep instructions.  LMOM for [place] in Endo.

## 2020-02-17 ENCOUNTER — Other Ambulatory Visit (HOSPITAL_COMMUNITY): Payer: Medicare Other

## 2020-03-04 ENCOUNTER — Other Ambulatory Visit (HOSPITAL_COMMUNITY)
Admission: RE | Admit: 2020-03-04 | Discharge: 2020-03-04 | Disposition: A | Discharge status: Home / Self Care | Payer: Medicare Other | Source: Ambulatory Visit | Attending: Internal Medicine | Admitting: Internal Medicine

## 2020-03-04 ENCOUNTER — Other Ambulatory Visit: Payer: Self-pay

## 2020-03-04 DIAGNOSIS — Z01812 Encounter for preprocedural laboratory examination: Secondary | ICD-10-CM | POA: Insufficient documentation

## 2020-03-04 DIAGNOSIS — Z20822 Contact with and (suspected) exposure to covid-19: Secondary | ICD-10-CM | POA: Insufficient documentation

## 2020-03-04 LAB — SARS CORONAVIRUS 2 (TAT 6-24 HRS): SARS Coronavirus 2: NEGATIVE

## 2020-03-06 ENCOUNTER — Ambulatory Visit (HOSPITAL_COMMUNITY)
Admission: RE | Admit: 2020-03-06 | Discharge: 2020-03-06 | Disposition: A | Discharge status: Home / Self Care | Payer: Medicare Other | Source: Ambulatory Visit | Attending: Internal Medicine | Admitting: Internal Medicine

## 2020-03-06 ENCOUNTER — Encounter (HOSPITAL_COMMUNITY)
Admission: RE | Disposition: A | Discharge status: Home / Self Care | Payer: Self-pay | Source: Ambulatory Visit | Attending: Internal Medicine

## 2020-03-06 ENCOUNTER — Encounter (HOSPITAL_COMMUNITY): Payer: Self-pay | Admitting: Internal Medicine

## 2020-03-06 ENCOUNTER — Other Ambulatory Visit: Payer: Self-pay

## 2020-03-06 DIAGNOSIS — K219 Gastro-esophageal reflux disease without esophagitis: Secondary | ICD-10-CM | POA: Diagnosis not present

## 2020-03-06 DIAGNOSIS — Z7982 Long term (current) use of aspirin: Secondary | ICD-10-CM | POA: Insufficient documentation

## 2020-03-06 DIAGNOSIS — Z7952 Long term (current) use of systemic steroids: Secondary | ICD-10-CM | POA: Insufficient documentation

## 2020-03-06 DIAGNOSIS — K319 Disease of stomach and duodenum, unspecified: Secondary | ICD-10-CM | POA: Diagnosis not present

## 2020-03-06 DIAGNOSIS — R1013 Epigastric pain: Secondary | ICD-10-CM | POA: Insufficient documentation

## 2020-03-06 DIAGNOSIS — Z87891 Personal history of nicotine dependence: Secondary | ICD-10-CM | POA: Insufficient documentation

## 2020-03-06 DIAGNOSIS — K2270 Barrett's esophagus without dysplasia: Secondary | ICD-10-CM | POA: Diagnosis not present

## 2020-03-06 DIAGNOSIS — Z79899 Other long term (current) drug therapy: Secondary | ICD-10-CM | POA: Diagnosis not present

## 2020-03-06 HISTORY — PX: BIOPSY: SHX5522

## 2020-03-06 HISTORY — PX: ESOPHAGOGASTRODUODENOSCOPY: SHX5428

## 2020-03-06 SURGERY — EGD (ESOPHAGOGASTRODUODENOSCOPY)
Anesthesia: Moderate Sedation

## 2020-03-06 MED ORDER — STERILE WATER FOR IRRIGATION IR SOLN
Status: DC | PRN
  Administered 2020-03-06: 100 mL

## 2020-03-06 MED ORDER — MIDAZOLAM HCL 5 MG/5ML IJ SOLN
INTRAMUSCULAR | Status: AC
  Filled 2020-03-06: qty 10

## 2020-03-06 MED ORDER — MEPERIDINE HCL 50 MG/ML IJ SOLN
INTRAMUSCULAR | Status: AC
  Filled 2020-03-06: qty 1

## 2020-03-06 MED ORDER — MEPERIDINE HCL 100 MG/ML IJ SOLN
INTRAMUSCULAR | Status: DC | PRN
  Administered 2020-03-06: 15 mg
  Administered 2020-03-06: 25 mg

## 2020-03-06 MED ORDER — ONDANSETRON HCL 4 MG/2ML IJ SOLN
INTRAMUSCULAR | Status: AC
  Filled 2020-03-06: qty 2

## 2020-03-06 MED ORDER — LIDOCAINE VISCOUS HCL 2 % MT SOLN
OROMUCOSAL | Status: AC
  Filled 2020-03-06: qty 15

## 2020-03-06 MED ORDER — MIDAZOLAM HCL 5 MG/5ML IJ SOLN
INTRAMUSCULAR | Status: DC | PRN
  Administered 2020-03-06 (×2): 2 mg via INTRAVENOUS

## 2020-03-06 MED ORDER — ONDANSETRON HCL 4 MG/2ML IJ SOLN
INTRAMUSCULAR | Status: DC | PRN
  Administered 2020-03-06: 4 mg via INTRAVENOUS

## 2020-03-06 MED ORDER — LIDOCAINE VISCOUS HCL 2 % MT SOLN
OROMUCOSAL | Status: DC | PRN
  Administered 2020-03-06: 6 mL via OROMUCOSAL

## 2020-03-06 MED ORDER — SODIUM CHLORIDE 0.9 % IV SOLN: INTRAVENOUS | Status: DC

## 2020-03-06 NOTE — H&P (Signed)
@LOGO @   Primary Care Physician:  Earney Mallet, MD Primary Gastroenterologist:  Dr. Gala Romney  Pre-Procedure History & Physical: HPI:  Matthew Butler is a 74 y.o. male here for further evaluation of refractory reflux and epigastric pain.  No dysphagia.  Nexium 40 mg daily has helped symptoms.  On no antiplatelet or anticoagulation therapy.  Past Medical History:  Diagnosis Date  . BPH (benign prostatic hyperplasia)   . Coronary artery disease 2011  . GERD (gastroesophageal reflux disease)   . 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 HTN (hypertension)   . Hyperlipidemia     Past Surgical History:  Procedure Laterality Date  . APPENDECTOMY    . CHOLECYSTECTOMY N/A 04/18/2014   Procedure: LAPAROSCOPIC CHOLECYSTECTOMY;  Surgeon: Jamesetta So, MD;  Location: [facility];  Service: General;  Laterality: N/A;  . COLONOSCOPY N/A 02/28/2018   Ardit Danh: Diverticulosis.  Numerous tubular adenomas removed.  Surveillance colonoscopy advised for November 2022.  Marland Kitchen CORONARY ANGIOPLASTY WITH STENT PLACEMENT  2018  . CORONARY ARTERY BYPASS GRAFT  2011  . KNEE ARTHROPLASTY    . MELANOMA EXCISION  2016   Left face  . POLYPECTOMY  02/28/2018   Procedure: POLYPECTOMY;  Surgeon: [name], MD;  Location: [facility] SUITE;  Service: Endoscopy;;  (colon)  . SPLENECTOMY  1980   For Hodgkin's lymphoma    Prior to Admission medications   Medication Sig Start Date End Date Taking? Authorizing Provider  aspirin EC 81 MG tablet Take 81 mg by mouth daily.   Yes [provider]  esomeprazole (NEXIUM) 40 MG capsule Take 1 capsule (40 mg total) by mouth 2 (two) times daily before a meal. 01/29/20  Yes Mahala Menghini, PA-C  finasteride (PROSCAR) 5 MG tablet Take 5 mg by mouth daily.   Yes [provider]  metoprolol succinate (TOPROL-XL) 25 MG 24 hr tablet Take 25 mg by mouth daily.   02/24/14  Yes [provider]  nitroGLYCERIN (NITROSTAT) 0.4 MG SL tablet Place 0.4 mg under the tongue every 5 (five) minutes x 3 doses as needed for chest pain.    Yes [provider]  predniSONE (DELTASONE) 5 MG tablet Take 5 mg by mouth daily. 01/29/18  Yes [provider]  simvastatin (ZOCOR) 40 MG tablet Take 40 mg by mouth at bedtime.  02/24/14  Yes [provider]  tamsulosin (FLOMAX) 0.4 MG CAPS capsule Take 0.4 mg by mouth daily.  02/24/14  Yes [provider]  ibuprofen (ADVIL) 200 MG tablet Take 600 mg by mouth every 8 (eight) hours as needed (pain.).    [provider]    Allergies as of 01/29/2020  . (No Known Allergies)    Family History  Problem Relation Age of Onset  . Melanoma Maternal Grandmother   . Congestive Heart Failure Mother   . Deep vein thrombosis Mother   . Bowel Disease Father        [place]  . Heart attack Brother   . Diabetes Brother     Social History   Socioeconomic History  . Marital status: Married    Spouse name: Not on file  . Number of children: 1  . Years of education: Not on file  . Highest education level: Not on file  Occupational History  . Occupation: Retired    Comment: Retired Social research officer, government  Tobacco Use  .  Smoking status: Former Smoker    Types: Cigars  . Smokeless tobacco: Never Used  Vaping Use  . Vaping Use: Never used  Substance and Sexual Activity  . Alcohol use: Not Currently    Alcohol/week: 0.0 standard drinks    Comment: occassional; denied 02/07/18  . Drug use: No  . Sexual activity: Not on file  Other Topics Concern  . Not on file  Social History Narrative  . Not on file   Social Determinants of Health   Financial Resource Strain:   . Difficulty of Paying Living Expenses: Not on file  Food Insecurity:   . Worried About Charity fundraiser in the Last Year: Not on file  . Ran Out of Food in the Last Year: Not on file  Transportation Needs:   . Lack of  Transportation (Medical): Not on file  . Lack of Transportation (Non-Medical): Not on file  Physical Activity:   . Days of Exercise per Week: Not on file  . Minutes of Exercise per Session: Not on file  Stress:   . Feeling of Stress : Not on file  Social Connections:   . Frequency of Communication with Friends and Family: Not on file  . Frequency of Social Gatherings with Friends and Family: Not on file  . Attends Religious Services: Not on file  . Active Member of Clubs or Organizations: Not on file  . Attends Archivist Meetings: Not on file  . Marital Status: Not on file  Intimate Partner Violence:   . Fear of Current or Ex-Partner: Not on file  . Emotionally Abused: Not on file  . Physically Abused: Not on file  . Sexually Abused: Not on file    Review of Systems: See HPI, otherwise negative ROS  Physical Exam: BP (!) 148/70   Pulse 74   Temp 97.9 F (36.6 C) (Oral)   Resp 19   Ht 6' (1.829 m)   Wt 94.3 kg   SpO2 97%   BMI 28.21 kg/m  General:   Alert,  Well-developed, well-nourished, pleasant and cooperative in NAD Neck:  Supple; no masses or thyromegaly. No significant cervical adenopathy. Lungs:  Clear throughout to auscultation.   No wheezes, crackles, or rhonchi. No acute distress. Heart:  Regular rate and rhythm; no murmurs, clicks, rubs,  or gallops. Abdomen: Non-distended, normal bowel sounds.  Soft and nontender without appreciable mass or hepatosplenomegaly.  Pulses:  Normal pulses noted. Extremities:  Without clubbing or edema.  Impression/Plan: 74 year old gentleman with refractory reflux/epigastric pain improved with Nexium 40 mg daily.  No dysphagia.  EGD now being done for diagnostic purpose.  The risks, benefits, limitations, alternatives and imponderables have been reviewed with the patient. Potential for esophageal dilation, biopsy, etc. have also been reviewed.  Questions have been answered. All parties agreeable.     Notice: This  dictation was prepared with Dragon dictation along with smaller phrase technology. Any transcriptional errors that result from this process are unintentional and may not be corrected upon review.

## 2020-03-06 NOTE — Discharge Instructions (Signed)
EGD Discharge instructions Please read the instructions outlined below and refer to this sheet in the next few weeks. These discharge instructions provide you with general information on caring for yourself after you leave the hospital. Your doctor may also give you specific instructions. While your treatment has been planned according to the most current medical practices available, unavoidable complications occasionally occur. If you have any problems or questions after discharge, please call your doctor. ACTIVITY  You may resume your regular activity but move at a slower pace for the next 24 hours.   Take frequent rest periods for the next 24 hours.   Walking will help expel (get rid of) the air and reduce the bloated feeling in your abdomen.   No driving for 24 hours (because of the anesthesia (medicine) used during the test).   You may shower.   Do not sign any important legal documents or operate any machinery for 24 hours (because of the anesthesia used during the test).  NUTRITION  Drink plenty of fluids.   You may resume your normal diet.   Begin with a light meal and progress to your normal diet.   Avoid alcoholic beverages for 24 hours or as instructed by your caregiver.  MEDICATIONS  You may resume your normal medications unless your caregiver tells you otherwise.  WHAT YOU CAN EXPECT TODAY  You may experience abdominal discomfort such as a feeling of fullness or "gas" pains.  FOLLOW-UP  Your doctor will discuss the results of your test with you.  SEEK IMMEDIATE MEDICAL ATTENTION IF ANY OF THE FOLLOWING OCCUR:  Excessive nausea (feeling sick to your stomach) and/or vomiting.   Severe abdominal pain and distention (swelling).   Trouble swallowing.   Temperature over 101 F (37.8 C).   Rectal bleeding or vomiting of blood.    Hernia, Adult     A hernia happens when tissue inside your body pushes out through a weak spot in your belly muscles (abdominal  wall). This makes a round lump (bulge). The lump may be:  In a scar from surgery that was done in your belly (incisional hernia).  Near your belly button (umbilical hernia).  In your groin (inguinal hernia). Your groin is the area where your leg meets your lower belly (abdomen). This kind of hernia could also be: ? In your scrotum, if you are male. ? In folds of skin around your vagina, if you are male.  In your upper thigh (femoral hernia).  Inside your belly (hiatal hernia). This happens when your stomach slides above the muscle between your belly and your chest (diaphragm). If your hernia is small and it does not cause pain, you may not need treatment. If your hernia is large or it causes pain, you may need surgery. Follow these instructions at home: Activity  Avoid stretching or overusing (straining) the muscles near your hernia. Straining can happen when you: ? Lift something heavy. ? Poop (have a bowel movement).  Do not lift anything that is heavier than 10 lb (4.5 kg), or the limit that you are told, until your doctor says that it is safe.  Use the strength of your legs when you lift something heavy. Do not use only your back muscles to lift. General instructions  Do these things if told by your doctor so you do not have trouble pooping (constipation): ? Drink enough fluid to keep your pee (urine) pale yellow. ? Eat foods that are high in fiber. These include fresh fruits and vegetables,  whole grains, and beans. ? Limit foods that are high in fat and processed sugars. These include foods that are fried or sweet. ? Take medicine for trouble pooping.  When you cough, try to cough gently.  You may try to push your hernia in by very gently pressing on it when you are lying down. Do not try to force the bulge back in if it will not push in easily.  If you are overweight, work with your doctor to lose weight safely.  Do not use any products that have nicotine or tobacco in  them. These include cigarettes and e-cigarettes. If you need help quitting, ask your doctor.  If you will be having surgery (hernia repair), watch your hernia for changes in shape, size, or color. Tell your doctor if you see any changes.  Take over-the-counter and prescription medicines only as told by your doctor.  Keep all follow-up visits as told by your doctor. Contact a doctor if:  You get new pain, swelling, or redness near your hernia.  You poop fewer times in a week than normal.  You have trouble pooping.  You have poop (stool) that is more dry than normal.  You have poop that is harder or larger than normal. Get help right away if:  You have a fever.  You have belly pain that gets worse.  You feel sick to your stomach (nauseous).  You throw up (vomit).  Your hernia cannot be pushed in by very gently pressing on it when you are lying down. Do not try to force the bulge back in if it will not push in easily.  Your hernia: ? Changes in shape or size. ? Changes color. ? Feels hard or it hurts when you touch it. These symptoms may represent a serious problem that is an emergency. Do not wait to see if the symptoms will go away. Get medical help right away. Call your local emergency services (911 in the [place] Summary  A hernia happens when tissue inside your body pushes out through a weak spot in the belly muscles. This creates a bulge.  If your hernia is small and it does not hurt, you may not need treatment. If your hernia is large or it hurts, you may need surgery.  If you will be having surgery, watch your hernia for changes in shape, size, or color. Tell your doctor about any changes. This information is not intended to replace advice given to you by your health care provider. Make sure you discuss any questions you have with your health care provider. Document Revised: 07/12/2018 Document Reviewed: 12/21/2016 Elsevier Patient Education  [place] have a large hiatal hernia which is causing acid reflux.  Your stomach was inflamed.  Biopsies taken.  Increase Nexium to 40 mg before breakfast and supper time.  New prescription provided  Further recommendations to follow pending review of pathology report  At patient request, I called Pernell Dupre at [phone] and left a detailed message with results and recommendations

## 2020-03-09 ENCOUNTER — Other Ambulatory Visit: Payer: Self-pay

## 2020-03-09 LAB — SURGICAL PATHOLOGY

## 2020-03-10 NOTE — Op Note (Signed)
[facility] Patient Name: Matthew Butler Procedure Date: 03/06/2020 10:47 AM MRN: [id number] Date of Birth: 1945-12-24 Attending MD: Norvel Richards , MD CSN: [id number] Age: 74 Admit Type: Outpatient Procedure:                Upper GI endoscopy Indications:              GERD, Epigastric abdominal pain Providers:                Norvel Richards, MD, Tammy Vaught, RN, Lambert Mody, Aram Candela Referring MD:              Medicines:                Midazolam 4 mg IV, Meperidine 40 mg IV Complications:            No immediate complications. Estimated Blood Loss:     Estimated blood loss was minimal. Procedure:                Pre-Anesthesia Assessment:                           - Prior to the procedure, a History and Physical                            was performed, and patient medications and                            allergies were reviewed. The patient's tolerance of                            previous anesthesia was also reviewed. The risks                            and benefits of the procedure and the sedation                            options and risks were discussed with the patient.                            All questions were answered, and informed consent                            was obtained. Prior Anticoagulants: The patient has                            taken no previous anticoagulant or antiplatelet                            agents. ASA Grade Assessment: II - A patient with                            mild systemic disease. After reviewing the risks  and benefits, the patient was deemed in                            satisfactory condition to undergo the procedure.                           After obtaining informed consent, the endoscope was                            passed under direct vision. Throughout the                            procedure, the patient's blood pressure, pulse, and                             oxygen saturations were monitored continuously. The                            [id number]-[id number] ([id number]) was introduced through the                            mouth, and advanced to the second part of duodenum.                            The upper GI endoscopy was accomplished without                            difficulty. The patient tolerated the procedure                            well. Scope In: 11:12:05 AM Scope Out: 11:19:49 AM Total Procedure Duration: 0 hours 7 minutes 44 seconds  Findings:      Accentuated, undulating Z-line versus short broad tongue of Barrett's       epithelium. No esophagitis. No nodularity. Patchy gastric erythema. No       ulcer or infiltrating process. Patent pylorus.      The duodenal bulb and second portion of the duodenum were normal. Biopsy       of the distal esophagus taken. Biopsy of the abnormal gastric mucosa       taken. Impression:               -Abnormal distal esophagus query short segment                            Barrett's esophagus. Status post biopsy. Gastric                            erythema of doubtful clinical significance. Status                            post biopsy                           Normal duodenal bulb and second portion of the  duodenum. Moderate Sedation:      Moderate (conscious) sedation was administered by the endoscopy nurse       and supervised by the endoscopist. The following parameters were       monitored: oxygen saturation, heart rate, blood pressure, respiratory       rate, EKG, adequacy of pulmonary ventilation, and response to care. Recommendation:           - Patient has a contact number available for                            emergencies. The signs and symptoms of potential                            delayed complications were discussed with the                            patient. Return to normal activities tomorrow.                            Written discharge  instructions were provided to the                            patient.                           - Advance diet as tolerated. ` Follow-up on                            pathology. Procedure Code(s):        --- Professional ---                           [phone], Esophagogastroduodenoscopy, flexible,                            transoral; diagnostic, including collection of                            specimen(s) by brushing or washing, when performed                            (separate procedure) Diagnosis Code(s):        --- Professional ---                           R10.13, Epigastric pain CPT copyright 2019 American Medical Association. All rights reserved. The codes documented in this report are preliminary and upon coder review may  be revised to meet current compliance requirements. [name]. Mashell Sieben, MD Norvel Richards, MD 03/10/2020 12:30:29 PM This report has been signed electronically. Number of Addenda: 0

## 2020-03-12 ENCOUNTER — Encounter (HOSPITAL_COMMUNITY): Payer: Self-pay | Admitting: Internal Medicine

## 2020-03-13 ENCOUNTER — Encounter: Payer: Self-pay | Admitting: Internal Medicine

## 2020-03-13 ENCOUNTER — Telehealth: Payer: Self-pay

## 2020-03-13 NOTE — Telephone Encounter (Signed)
OV made and appt card mailed °

## 2020-03-13 NOTE — Telephone Encounter (Signed)
Per Dr.Rourk- Send letter to patient.  Send copy of letter with path to referring provider and PCP.   Should have an office visit with Neil Crouch in 4 to 6 weeks from now if not already scheduled.

## 2020-04-28 ENCOUNTER — Ambulatory Visit: Payer: Medicare Other | Admitting: Gastroenterology

## 2020-06-10 ENCOUNTER — Ambulatory Visit (INDEPENDENT_AMBULATORY_CARE_PROVIDER_SITE_OTHER): Payer: Medicare Other | Admitting: Gastroenterology

## 2020-06-10 ENCOUNTER — Other Ambulatory Visit: Payer: Self-pay

## 2020-06-10 ENCOUNTER — Encounter: Payer: Self-pay | Admitting: Gastroenterology

## 2020-06-10 VITALS — BP 106/60 | HR 76 | Temp 97.1°F | Ht 69.0 in | Wt 214.6 lb

## 2020-06-10 DIAGNOSIS — K219 Gastro-esophageal reflux disease without esophagitis: Secondary | ICD-10-CM | POA: Diagnosis not present

## 2020-06-10 MED ORDER — ESOMEPRAZOLE MAGNESIUM 40 MG PO CPDR: 40.0000 mg | DELAYED_RELEASE_CAPSULE | Freq: Two times a day (BID) | ORAL | 5 refills | Status: DC

## 2020-06-10 NOTE — Progress Notes (Signed)
Primary Care Physician: Earney Mallet, MD  Primary Gastroenterologist:  Garfield Cornea, MD   Chief Complaint  Patient presents with  . Gastroesophageal Reflux    Has little indigestion    HPI: Matthew Butler is a 75 y.o. male here for follow-up.  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  August 2018 he had coronary artery stent placement.  Patient has history of remote bleeding duodenal ulcer.  Diagnosed with H. pylori and underwent treatment at that time.  Recent diagnosis of bladder cancer, May 28, 2020.  EGD December 2021: Abnormal distal esophagus query short segment Barrett's esophagus. Status post biopsy showed gastric type mucosa with chronic inflammation but no squamous mucosa or intestinal metaplasia. Gastric erythema of doubtful clinical significance. Status post biopsy which showed reactive gastropathy, no H. Pylori. Normal duodenal bulb and second portion of the duodenum.  Also noted to have large hiatal hernia.  Not included in op note however present on patient AVS and documented by picture.  Last colonoscopy in November 2019, numerous tubular adenomas removed, diverticulosis, recommended surveillance colonoscopy in 3 years.  Overall doing well.  Only occasional episode of waking up in the middle the night with reflux.  Used to happen almost every night.  Typical heartburn well controlled.  Has indigestion/pressure if he eats a large meal or tomato-based food.  No dysphagia.  No abdominal pain.  Bowel movements fairly regular.  No melena or rectal bleeding.  Current Outpatient Medications  Medication Sig Dispense Refill  . aspirin EC 81 MG tablet Take 81 mg by mouth daily.    Marland Kitchen esomeprazole (NEXIUM) 40 MG capsule Take 1 capsule by mouth 2 (two) times daily.    . finasteride (PROSCAR) 5 MG tablet Take 5 mg by  mouth daily.    Marland Kitchen ibuprofen (ADVIL) 200 MG tablet Take 600 mg by mouth every 8 (eight) hours as needed (pain.).    Marland Kitchen metoprolol succinate (TOPROL-XL) 25 MG 24 hr tablet Take 25 mg by mouth daily.     . nitroGLYCERIN (NITROSTAT) 0.4 MG SL tablet Place 0.4 mg under the tongue every 5 (five) minutes x 3 doses as needed for chest pain.     . predniSONE (DELTASONE) 5 MG tablet Take 5 mg by mouth daily.  1  . simvastatin (ZOCOR) 40 MG tablet Take 40 mg by mouth at bedtime.     . tamsulosin (FLOMAX) 0.4 MG CAPS capsule Take 0.4 mg by mouth daily.      No current facility-administered medications for this visit.    Allergies as of 06/10/2020  . (No Known Allergies)    ROS:  General: Negative for anorexia, weight loss, fever, chills, fatigue, weakness. ENT: Negative for hoarseness, difficulty swallowing , nasal congestion. CV: Negative for chest pain, angina, palpitations, dyspnea on exertion, peripheral edema.  Respiratory: Negative for dyspnea at rest, dyspnea on exertion, cough, sputum, wheezing.  GI: See history of present illness. GU:  Negative for dysuria, hematuria, urinary incontinence, urinary frequency, nocturnal urination.  Endo: Negative for unusual weight change.    Physical Examination:   BP 106/60   Pulse 76   Temp (!) 97.1 F (36.2 C) (Temporal)   Ht 5\' 9"  (1.753 m)   Wt 214 lb 9.6 oz (97.3 kg)   BMI 31.69 kg/m   General: Well-nourished, well-developed in no acute distress.  Eyes: No  icterus. Mouth: masked.  Abdomen: Bowel sounds are normal, nontender, nondistended, no hepatosplenomegaly or masses, no abdominal bruits or hernia , no rebound or guarding.   Extremities: No lower extremity edema. No clubbing or deformities. Neuro: Alert and oriented x 4   Skin: Warm and dry, no jaundice.   Psych: Alert and cooperative, normal mood and affect.   Impression/plan:  Pleasant 75 year old male with chronic reflux somewhat refractory to treatment/difficult to manage.   Symptoms typically worse in the evenings.  Completed EGD back in December with evidence of reflux, reactive gastropathy.  Noted to have large hiatal hernia.  Started on Nexium 40 mg twice daily before meals.  He has been doing much better.  Rarely has nocturnal symptoms.  Continues to have some occasional indigestion if he eats the wrong foods, typically tomato based or large meals.  History of numerous adenomatous colon polyps, surveillance colonoscopy due in November 2022.  Plan: Continue Nexium 40 mg twice daily before meals.  May use Pepcid/Tums/Rolaids as needed for breakthrough symptoms.  Once he has had adequate control of symptoms for at least 8 weeks, he can try backing down to once daily Nexium if tolerated.  He has been given instructions that if he has breakthrough symptoms more than 2-3 times per week, he should not resume Nexium twice daily.  Colonoscopy in November 2022, we will send a reminder.  Otherwise we will see him back in 1 year for reflux.

## 2020-06-10 NOTE — Patient Instructions (Signed)
1. Continue Nexium 40 mg 30 minutes before breakfast and before evening meal.  You can use over-the-counter Pepcid, Tums, Rolaids as needed for breakthrough symptoms. 2. If you get to the point your symptoms are well controlled, try cutting back to once daily Nexium if tolerated.  If you have breakthrough heartburn symptoms more than 2-3 times per week then you may need to take Nexium twice daily indefinitely.   3. Your next colonoscopy is due in November 2022 due to history of numerous precancerous colon polyps.  We will send you a reminder letter. 4. Otherwise we will see you back in 1 year for reflux.   Food Choices for Gastroesophageal Reflux Disease, Adult When you have gastroesophageal reflux disease (GERD), the foods you eat and your eating habits are very important. Choosing the right foods can help ease the discomfort of GERD. Consider working with a dietitian to help you make healthy food choices. What are tips for following this plan? Reading food labels  Look for foods that are low in saturated fat. Foods that have less than 5% of daily value (DV) of fat and 0 g of trans fats may help with your symptoms. Cooking  Cook foods using methods other than frying. This may include baking, steaming, grilling, or broiling. These are all methods that do not need a lot of fat for cooking.  To add flavor, try to use herbs that are low in spice and acidity. Meal planning  Choose healthy foods that are low in fat, such as fruits, vegetables, whole grains, low-fat dairy products, lean meats, fish, and poultry.  Eat frequent, small meals instead of three large meals each day. Eat your meals slowly, in a relaxed setting. Avoid bending over or lying down until 2-3 hours after eating.  Limit high-fat foods such as fatty meats or fried foods.  Limit your intake of fatty foods, such as oils, butter, and shortening.  Avoid the following as told by your health care provider: ? Foods that cause  symptoms. These may be different for different people. Keep a food diary to keep track of foods that cause symptoms. ? Alcohol. ? Drinking large amounts of liquid with meals. ? Eating meals during the 2-3 hours before bed.   Lifestyle  Maintain a healthy weight. Ask your health care provider what weight is healthy for you. If you need to lose weight, work with your health care provider to do so safely.  Exercise for at least 30 minutes on 5 or more days each week, or as told by your health care provider.  Avoid wearing clothes that fit tightly around your waist and chest.  Do not use any products that contain nicotine or tobacco. These products include cigarettes, chewing tobacco, and vaping devices, such as e-cigarettes. If you need help quitting, ask your health care provider.  Sleep with the head of your bed raised. Use a wedge under the mattress or blocks under the bed frame to raise the head of the bed.  Chew sugar-free gum after mealtimes. What foods should I eat? Eat a healthy, well-balanced diet of fruits, vegetables, whole grains, low-fat dairy products, lean meats, fish, and poultry. Each person is different. Foods that may trigger symptoms in one person may not trigger any symptoms in another person. Work with your health care provider to identify foods that are safe for you. The items listed above may not be a complete list of recommended foods and beverages. Contact a dietitian for more information.   What  foods should I avoid? Limiting some of these foods may help manage the symptoms of GERD. Everyone is different. Consult a dietitian or your health care provider to help you identify the exact foods to avoid, if any. Fruits Any fruits prepared with added fat. Any fruits that cause symptoms. For some people this may include citrus fruits, such as oranges, grapefruit, pineapple, and lemons. Vegetables Deep-fried vegetables. [place] fries. Any vegetables prepared with added fat.  Any vegetables that cause symptoms. For some people, this may include tomatoes and tomato products, chili peppers, onions and garlic, and horseradish. Grains Pastries or quick breads with added fat. Meats and other proteins High-fat meats, such as fatty beef or pork, hot dogs, ribs, ham, sausage, salami, and bacon. Fried meat or protein, including fried fish and fried chicken. Nuts and nut butters, in large amounts. Dairy Whole milk and chocolate milk. Sour cream. Cream. Ice cream. Cream cheese. Milkshakes. Fats and oils Butter. Margarine. Shortening. Ghee. Beverages Coffee and tea, with or without caffeine. Carbonated beverages. Sodas. Energy drinks. Fruit juice made with acidic fruits, such as orange or grapefruit. Tomato juice. Alcoholic drinks. Sweets and desserts Chocolate and cocoa. Donuts. Seasonings and condiments Pepper. Peppermint and spearmint. Added salt. Any condiments, herbs, or seasonings that cause symptoms. For some people, this may include curry, hot sauce, or vinegar-based salad dressings. The items listed above may not be a complete list of foods and beverages to avoid. Contact a dietitian for more information. Questions to ask your health care provider Diet and lifestyle changes are usually the first steps that are taken to manage symptoms of GERD. If diet and lifestyle changes do not improve your symptoms, talk with your health care provider about taking medicines. Where to find more information  International Foundation for Gastrointestinal Disorders: aboutgerd.org Summary  When you have gastroesophageal reflux disease (GERD), food and lifestyle choices may be very helpful in easing the discomfort of GERD.  Eat frequent, small meals instead of three large meals each day. Eat your meals slowly, in a relaxed setting. Avoid bending over or lying down until 2-3 hours after eating.  Limit high-fat foods such as fatty meats or fried foods. This information is not intended  to replace advice given to you by your health care provider. Make sure you discuss any questions you have with your health care provider. Document Revised: 09/30/2019 Document Reviewed: 09/30/2019 Elsevier Patient Education  [place].   [employer]'s Current Therapy 2021 (pp. 213-216). [place], [place]: Elsevier.">  Gastroesophageal Reflux Disease, Adult Gastroesophageal reflux (GER) happens when acid from the stomach flows up into the tube that connects the mouth and the stomach (esophagus). Normally, food travels down the esophagus and stays in the stomach to be digested. However, when a person has GER, food and stomach acid sometimes move back up into the esophagus. If this becomes a more serious problem, the person may be diagnosed with a disease called gastroesophageal reflux disease (GERD). GERD occurs when the reflux:  Happens often.  Causes frequent or severe symptoms.  Causes problems such as damage to the esophagus. When stomach acid comes in contact with the esophagus, the acid may cause inflammation in the esophagus. Over time, GERD may create small holes (ulcers) in the lining of the esophagus. What are the causes? This condition is caused by a problem with the muscle between the esophagus and the stomach (lower esophageal sphincter, or LES). Normally, the LES muscle closes after food passes through the esophagus to the stomach. When the LES  is weakened or abnormal, it does not close properly, and that allows food and stomach acid to go back up into the esophagus. The LES can be weakened by certain dietary substances, medicines, and medical conditions, including:  Tobacco use.  Pregnancy.  Having a hiatal hernia.  Alcohol use.  Certain foods and beverages, such as coffee, chocolate, onions, and peppermint. What increases the risk? You are more likely to develop this condition if you:  Have an increased body weight.  Have a connective tissue disorder.  Take NSAIDs,  such as ibuprofen. What are the signs or symptoms? Symptoms of this condition include:  Heartburn.  Difficult or painful swallowing and the feeling of having a lump in the throat.  A bitter taste in the mouth.  Bad breath and having a large amount of saliva.  Having an upset or bloated stomach and belching.  Chest pain. Different conditions can cause chest pain. Make sure you see your health care provider if you experience chest pain.  Shortness of breath or wheezing.  Ongoing (chronic) cough or a nighttime cough.  Wearing away of tooth enamel.  Weight loss. How is this diagnosed? This condition may be diagnosed based on a medical history and a physical exam. To determine if you have mild or severe GERD, your health care provider may also monitor how you respond to treatment. You may also have tests, including:  A test to examine your stomach and esophagus with a small camera (endoscopy).  A test that measures the acidity level in your esophagus.  A test that measures how much pressure is on your esophagus.  A barium swallow or modified barium swallow test to show the shape, size, and functioning of your esophagus. How is this treated? Treatment for this condition may vary depending on how severe your symptoms are. Your health care provider may recommend:  Changes to your diet.  Medicine.  Surgery. The goal of treatment is to help relieve your symptoms and to prevent complications. Follow these instructions at home: Eating and drinking  Follow a diet as recommended by your health care provider. This may involve avoiding foods and drinks such as: ? Coffee and tea, with or without caffeine. ? Drinks that contain alcohol. ? Energy drinks and sports drinks. ? Carbonated drinks or sodas. ? Chocolate and cocoa. ? Peppermint and mint flavorings. ? Garlic and onions. ? Horseradish. ? Spicy and acidic foods, including peppers, chili powder, curry powder, vinegar, hot  sauces, and barbecue sauce. ? Citrus fruit juices and citrus fruits, such as oranges, lemons, and limes. ? Tomato-based foods, such as red sauce, chili, salsa, and pizza with red sauce. ? Fried and fatty foods, such as donuts, french fries, potato chips, and high-fat dressings. ? High-fat meats, such as hot dogs and fatty cuts of red and white meats, such as rib eye steak, sausage, ham, and bacon. ? High-fat dairy items, such as whole milk, butter, and cream cheese.  Eat small, frequent meals instead of large meals.  Avoid drinking large amounts of liquid with your meals.  Avoid eating meals during the 2-3 hours before bedtime.  Avoid lying down right after you eat.  Do not exercise right after you eat.   Lifestyle  Do not use any products that contain nicotine or tobacco. These products include cigarettes, chewing tobacco, and vaping devices, such as e-cigarettes. If you need help quitting, ask your health care provider.  Try to reduce your stress by using methods such as yoga or meditation. If  you need help reducing stress, ask your health care provider.  If you are overweight, reduce your weight to an amount that is healthy for you. Ask your health care provider for guidance about a safe weight loss goal.   General instructions  Pay attention to any changes in your symptoms.  Take over-the-counter and prescription medicines only as told by your health care provider. Do not take aspirin, ibuprofen, or other NSAIDs unless your health care provider told you to take these medicines.  Wear loose-fitting clothing. Do not wear anything tight around your waist that causes pressure on your abdomen.  Raise (elevate) the head of your bed about 6 inches (15 cm). You can use a wedge to do this.  Avoid bending over if this makes your symptoms worse.  Keep all follow-up visits. This is important. Contact a health care provider if:  You have: ? New symptoms. ? Unexplained weight  loss. ? Difficulty swallowing or it hurts to swallow. ? Wheezing or a persistent cough. ? A hoarse voice.  Your symptoms do not improve with treatment. Get help right away if:  You have sudden pain in your arms, neck, jaw, teeth, or back.  You suddenly feel sweaty, dizzy, or light-headed.  You have chest pain or shortness of breath.  You vomit and the vomit is green, yellow, or black, or it looks like blood or coffee grounds.  You faint.  You have stool that is red, bloody, or black.  You cannot swallow, drink, or eat. These symptoms may represent a serious problem that is an emergency. Do not wait to see if the symptoms will go away. Get medical help right away. Call your local emergency services (911 in the [place] Do not drive yourself to the hospital. Summary  Gastroesophageal reflux happens when acid from the stomach flows up into the esophagus. GERD is a disease in which the reflux happens often, causes frequent or severe symptoms, or causes problems such as damage to the esophagus.  Treatment for this condition may vary depending on how severe your symptoms are. Your health care provider may recommend diet and lifestyle changes, medicine, or surgery.  Contact a health care provider if you have new or worsening symptoms.  Take over-the-counter and prescription medicines only as told by your health care provider. Do not take aspirin, ibuprofen, or other NSAIDs unless your health care provider told you to do so.  Keep all follow-up visits as told by your health care provider. This is important. This information is not intended to replace advice given to you by your health care provider. Make sure you discuss any questions you have with your health care provider. Document Revised: 09/30/2019 Document Reviewed: 09/30/2019 Elsevier Patient Education  [place].

## 2020-06-12 NOTE — Progress Notes (Signed)
Cc'ed to pcp °

## 2020-12-26 ENCOUNTER — Other Ambulatory Visit: Payer: Self-pay | Admitting: Gastroenterology

## 2021-01-28 ENCOUNTER — Other Ambulatory Visit: Payer: Self-pay | Admitting: Gastroenterology

## 2021-03-11 ENCOUNTER — Encounter: Payer: Self-pay | Admitting: *Deleted

## 2021-06-22 ENCOUNTER — Encounter: Payer: Self-pay | Admitting: Internal Medicine

## 2021-07-07 ENCOUNTER — Other Ambulatory Visit: Payer: Self-pay

## 2021-07-07 ENCOUNTER — Encounter: Payer: Self-pay | Admitting: Internal Medicine

## 2021-07-07 ENCOUNTER — Ambulatory Visit (INDEPENDENT_AMBULATORY_CARE_PROVIDER_SITE_OTHER): Payer: Medicare Other | Admitting: Internal Medicine

## 2021-07-07 ENCOUNTER — Encounter: Payer: Self-pay | Admitting: *Deleted

## 2021-07-07 VITALS — BP 118/62 | HR 69 | Temp 97.8°F | Ht 72.0 in | Wt 191.6 lb

## 2021-07-07 DIAGNOSIS — Z859 Personal history of malignant neoplasm, unspecified: Secondary | ICD-10-CM

## 2021-07-07 DIAGNOSIS — Z125 Encounter for screening for malignant neoplasm of prostate: Secondary | ICD-10-CM

## 2021-07-07 DIAGNOSIS — C678 Malignant neoplasm of overlapping sites of bladder: Secondary | ICD-10-CM

## 2021-07-07 DIAGNOSIS — R1013 Epigastric pain: Secondary | ICD-10-CM

## 2021-07-07 DIAGNOSIS — R3915 Urgency of urination: Secondary | ICD-10-CM

## 2021-07-07 NOTE — Progress Notes (Signed)
? ? ?Primary Care Physician:  Matthew Mallet, MD ?Primary Gastroenterologist:  Dr.  Marland Kitchen ?Pre-Procedure History & Physical: ?HPI:  Matthew Butler is a 76 y.o. male here for intermittent epigastric pain some intermittent early satiety.  11-monthhistory boring epigastric pain.  May come on at night sometimes after he eats may last a couple of hours and subside.  Has intermittent early satiety-not necessarily coupled with the abdominal pain.  Reflux symptoms well controlled on Nexium 40 mg daily.  No dysphagia. ?He is not afraid to eat.  He just fills up early.  Describes good bowel function;  no melena or rectal bleeding.  States he is lost 29 pounds in the past 6 months. ?History of cholecystitis/cholelithiasis.  Question of common duct stones at the time of his cholecystectomy..Matthew Butler Kitchen MRI MRCP negative. ?History of peptic ulcer (DU) with negative EGD a couple years ago and negative H. pylori by serology. ?Past medical history significant for Hodgkin's disease status post mantle radiation.  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 ?History of multiple colonic adenomas removed 2019; due for surveillance colonoscopy now as well. ?Patient states that the pain he is having reminds him of both his prior peptic ulcer and episodes of cholecystitis. ?Of note, patient has been taking ibuprofen regularly for back pain.  Again, taking esomeprazole concomitantly. ?Past Medical History:  ?Diagnosis Date  ? BPH (benign prostatic hyperplasia)   ? Coronary artery disease 2011  ? GERD (gastroesophageal reflux disease)   ? History of pulmonary embolus (PE)   ? After coronary artery bypass graft  ? Hodgkin disease ([place]   ? 1980s, status post splenectomy and radiation  ? HTN (hypertension)   ? Hyperlipidemia   ? ? ?Past Surgical History:  ?Procedure Laterality Date  ? APPENDECTOMY    ? BIOPSY  03/06/2020  ? Procedure: BIOPSY;  Surgeon: RDaneil Dolin MD;  [facility];   Service: Endoscopy;;  ? CHOLECYSTECTOMY N/A 04/18/2014  ? Procedure: LAPAROSCOPIC CHOLECYSTECTOMY;  Surgeon: MJamesetta So MD;  Location: [facility];  Service: General;  Laterality: N/A;  ? COLONOSCOPY N/A 02/28/2018  ? [name]: Diverticulosis.  Numerous tubular adenomas removed.  Surveillance colonoscopy advised for November 2022.  ? CORONARY ANGIOPLASTY WITH STENT PLACEMENT  2018  ? CORONARY ARTERY BYPASS GRAFT  2011  ? ESOPHAGOGASTRODUODENOSCOPY N/A 03/06/2020  ? Procedure: ESOPHAGOGASTRODUODENOSCOPY (EGD);  Surgeon: RDaneil Dolin MD;  [facility];  Service: Endoscopy;  Laterality: N/A;  11:00am  ? KNEE ARTHROPLASTY    ? MELANOMA EXCISION  2016  ? Left face  ? POLYPECTOMY  02/28/2018  ? Procedure: POLYPECTOMY;  Surgeon: RDaneil Dolin MD;  [facility]: [facility];  Service: Endoscopy;;  (colon)  ? SPLENECTOMY  1980  ? For Hodgkin's lymphoma  ? ? ?Prior to Admission medications   ?Medication Sig Start Date End Date Taking? Authorizing Provider  ?aspirin EC 81 MG tablet Take 81 mg by mouth daily.   Yes [provider]  ?esomeprazole (NEXIUM) 40 MG capsule Take 1 capsule by mouth twice daily 01/28/21  Yes LMahala Menghini PA-C  ?ibuprofen (ADVIL) 200 MG tablet Take 600 mg by mouth every 8 (eight) hours as needed (pain.).   Yes [provider]  ?metoprolol succinate (TOPROL-XL) 25 MG 24 hr tablet Take 25 mg by mouth daily.  02/24/14  Yes [provider]  ?nitroGLYCERIN (NITROSTAT) 0.4 MG SL tablet Place 0.4 mg under the tongue every 5 (five) minutes  x 3 doses as needed for chest pain.    Yes [provider]  ?predniSONE (DELTASONE) 5 MG tablet Take 5 mg by mouth daily. 01/29/18  Yes [provider]  ?simvastatin (ZOCOR) 40 MG tablet Take 40 mg by mouth at bedtime.  02/24/14  Yes [provider]  ?sucralfate (CARAFATE) 1 g tablet Take 1 g by mouth daily. 04/16/21  Yes [provider]  ? ? ?Allergies as of 07/07/2021  ? (No Known Allergies)   ? ? ?Family History  ?Problem Relation Age of Onset  ? Melanoma Maternal Grandmother   ? Congestive Heart Failure Mother   ? Deep vein thrombosis Mother   ? Bowel Disease Father   ?     Volvulus  ? Heart attack Brother   ? Diabetes Brother   ? ? ?Social History  ? ?Socioeconomic History  ? Marital status: Married  ?  Spouse name: Not on file  ? Number of children: 1  ? Years of education: Not on file  ? Highest education level: Not on file  ?Occupational History  ? Occupation: Retired  ?  Comment: Retired Social research officer, government  ?Tobacco Use  ? Smoking status: Former  ?  Types: Cigars  ? Smokeless tobacco: Never  ?Vaping Use  ? Vaping Use: Never used  ?Substance and Sexual Activity  ? Alcohol use: Not Currently  ?  Alcohol/week: 0.0 standard drinks  ?  Comment: occassional; denied 06/10/20  ? Drug use: No  ? Sexual activity: Yes  ?  Partners: Female  ?  Comment: spouse  ?Other Topics Concern  ? Not on file  ?Social History Narrative  ? Not on file  ? ?Social Determinants of Health  ? ?Financial Resource Strain: Not on file  ?Food Insecurity: Not on file  ?Transportation Needs: Not on file  ?Physical Activity: Not on file  ?Stress: Not on file  ?Social Connections: Not on file  ?Intimate Partner Violence: Not on file  ? ? ?Review of Systems: ?See HPI, otherwise negative ROS ? ?Physical Exam: ?BP 118/62 (BP Location: Left Arm, Patient Position: Sitting, Cuff Size: Normal)   Pulse 69   Temp 97.8 ?F (36.6 ?C) (Temporal)   Ht 6' (1.829 m)   Wt 191 lb 9.6 oz (86.9 kg)   SpO2 96%   BMI 25.99 kg/m?  ?General:   Alert,   pleasant and cooperative in NAD ?Nose:  No deformity, discharge,  or lesions. ?Mouth:  No deformity or lesions. ?Neck:  Supple; no masses or thyromegaly. No significant cervical adenopathy. ?Lungs:  Clear throughout to auscultation.   No wheezes, crackles, or rhonchi. No acute distress. ?Heart:  Regular rate and rhythm; no murmurs, clicks, rubs,  or gallops. ?Abdomen: Non-distended, normal bowel sounds.  Soft and  nontender without appreciable mass or hepatosplenomegaly.  ?Pulses:  Normal pulses noted. ?Extremities:  Without clubbing or edema. ?Rectal: Good sphincter tone.  No mass in the rectal vault.  Scant brown stool.  Hemoccult negative.  Slight nodularity both lobes of the prostate inferiorly.  Overall generous in size to palpation. ? ?Impression/Plan: Pleasant 76 year old gentleman with intermittent epigastric pain, early satiety and unintentional weight loss over the past 6 months.  GERD well-controlled on PPI.  History of peptic ulcer disease.  Recent NSAID use.  History of cholelithiasis with cholecystitis.  Gallbladder long gone. ?Prior history of Hodgkin's lymphoma and localized melanoma removed previously. ? ?His symptoms of early satiety and epigastric pain have somewhat of a biliary flavor.  Not really consistent  with acid peptic disease nor mesenteric ischemia.  Abnormal prostate by DRE today-she is being actively followed by urology. ?Hemoccult negative. ? ?He certainly needs further evaluation.                      ? ? ?Recommendations: ? ?As discussed, there are number of possibilities causing abdominal pain. ? ?We will initiate evaluation with a CBC, Chem-12, lipase.  We will also obtain a PSA (at your request) ? ?We will proceed with a CT of the abdomen and pelvis with and without IV contrast to further evaluate the cause of your pain. ? ?You will need a colonoscopy but this is not the first thing that needs to be done.  Depending on the findings of the above evaluation, you may need to have an upper endoscopy in the near future. ? ?Further recommendations to follow pending review of the above mentioned studies. ? ? ? ? ?Notice: This dictation was prepared with Dragon dictation along with smaller phrase technology. Any transcriptional errors that result from this process are unintentional and may not be corrected upon review.  ?

## 2021-07-07 NOTE — Patient Instructions (Signed)
It was good seeing you again today! ? ?As discussed, there are number of possibilities causing your abdominal pain. ? ?We will initiate evaluation with a CBC, Chem-12, lipase.  We will also obtain a PSA (at your request) ? ?We will proceed with a CT of the abdomen and pelvis with and without IV contrast to further evaluate the cause of your pain. ? ?You will need a colonoscopy but this is not the first thing that needs to be done.  Depending on the findings of the above evaluation, you may need to have an upper endoscopy in the near future. ? ?Further recommendations to follow pending review of the above mentioned studies. ?

## 2021-07-08 LAB — CBC WITH DIFFERENTIAL/PLATELET
Basophils Absolute: 0 10*3/uL (ref 0.0–0.2)
Basos: 0 %
EOS (ABSOLUTE): 0.1 10*3/uL (ref 0.0–0.4)
Eos: 1 %
Hematocrit: 43.7 % (ref 37.5–51.0)
Hemoglobin: 14.9 g/dL (ref 13.0–17.7)
Immature Grans (Abs): 0 10*3/uL (ref 0.0–0.1)
Immature Granulocytes: 0 %
Lymphocytes Absolute: 2 10*3/uL (ref 0.7–3.1)
Lymphs: 16 %
MCH: 32 pg (ref 26.6–33.0)
MCHC: 34.1 g/dL (ref 31.5–35.7)
MCV: 94 fL (ref 79–97)
Monocytes Absolute: 0.8 10*3/uL (ref 0.1–0.9)
Monocytes: 6 %
Neutrophils Absolute: 9.8 10*3/uL — ABNORMAL HIGH (ref 1.4–7.0)
Neutrophils: 77 %
Platelets: 372 10*3/uL (ref 150–450)
RBC: 4.65 x10E6/uL (ref 4.14–5.80)
RDW: 14.2 % (ref 11.6–15.4)
WBC: 12.8 10*3/uL — ABNORMAL HIGH (ref 3.4–10.8)

## 2021-07-08 LAB — COMPREHENSIVE METABOLIC PANEL
ALT: 20 IU/L (ref 0–44)
AST: 22 IU/L (ref 0–40)
Albumin/Globulin Ratio: 1.4 (ref 1.2–2.2)
Albumin: 4.2 g/dL (ref 3.7–4.7)
Alkaline Phosphatase: 73 IU/L (ref 44–121)
BUN/Creatinine Ratio: 33 — ABNORMAL HIGH (ref 10–24)
BUN: 34 mg/dL — ABNORMAL HIGH (ref 8–27)
Bilirubin Total: 0.7 mg/dL (ref 0.0–1.2)
CO2: 21 mmol/L (ref 20–29)
Calcium: 9.4 mg/dL (ref 8.6–10.2)
Chloride: 101 mmol/L (ref 96–106)
Creatinine, Ser: 1.04 mg/dL (ref 0.76–1.27)
Globulin, Total: 2.9 g/dL (ref 1.5–4.5)
Glucose: 97 mg/dL (ref 70–99)
Potassium: 4.3 mmol/L (ref 3.5–5.2)
Sodium: 138 mmol/L (ref 134–144)
Total Protein: 7.1 g/dL (ref 6.0–8.5)
eGFR: 75 mL/min/{1.73_m2} (ref >59)

## 2021-07-08 LAB — LIPASE: Lipase: 27 U/L (ref 13–78)

## 2021-07-08 LAB — PSA: Prostate Specific Ag, Serum: 5.3 ng/mL — ABNORMAL HIGH (ref 0.0–4.0)

## 2021-07-13 ENCOUNTER — Ambulatory Visit (HOSPITAL_COMMUNITY)
Admission: RE | Admit: 2021-07-13 | Discharge: 2021-07-13 | Disposition: A | Discharge status: Home / Self Care | Payer: Medicare Other | Source: Ambulatory Visit | Attending: Internal Medicine | Admitting: Internal Medicine

## 2021-07-13 DIAGNOSIS — R1013 Epigastric pain: Secondary | ICD-10-CM | POA: Diagnosis present

## 2021-07-13 DIAGNOSIS — Z859 Personal history of malignant neoplasm, unspecified: Secondary | ICD-10-CM | POA: Insufficient documentation

## 2021-07-13 MED ORDER — IOHEXOL 300 MG/ML  SOLN
100.0000 mL | Freq: Once | INTRAMUSCULAR | Status: AC | PRN
  Administered 2021-07-13: 100 mL via INTRAVENOUS

## 2021-07-28 ENCOUNTER — Ambulatory Visit: Payer: Medicare Other | Admitting: Gastroenterology

## 2021-08-05 ENCOUNTER — Encounter: Payer: Self-pay | Admitting: *Deleted

## 2021-08-05 ENCOUNTER — Telehealth: Payer: Self-pay | Admitting: *Deleted

## 2021-08-05 MED ORDER — PEG 3350-KCL-NA BICARB-NACL 420 G PO SOLR: ORAL | 0 refills | Status: DC

## 2021-08-05 NOTE — Telephone Encounter (Signed)
Called pt. He has been scheduled for procedures on 6/1 with Dr. Gala Romney. Aware will mail prep instructions with pre-op appointment. Rx sent to pharmacy for prep ?

## 2021-08-06 ENCOUNTER — Ambulatory Visit (HOSPITAL_COMMUNITY)
Admission: RE | Admit: 2021-08-06 | Discharge: 2021-08-06 | Disposition: A | Discharge status: Home / Self Care | Payer: Medicare Other | Source: Ambulatory Visit | Attending: Internal Medicine | Admitting: Internal Medicine

## 2021-08-06 ENCOUNTER — Encounter (HOSPITAL_COMMUNITY): Payer: Self-pay | Admitting: Internal Medicine

## 2021-08-06 VITALS — BP 116/62 | HR 80 | Wt 198.0 lb

## 2021-08-06 DIAGNOSIS — I951 Orthostatic hypotension: Secondary | ICD-10-CM | POA: Diagnosis present

## 2021-08-06 DIAGNOSIS — I10 Essential (primary) hypertension: Secondary | ICD-10-CM | POA: Diagnosis not present

## 2021-08-06 DIAGNOSIS — Z8711 Personal history of peptic ulcer disease: Secondary | ICD-10-CM | POA: Insufficient documentation

## 2021-08-06 DIAGNOSIS — Z8571 Personal history of Hodgkin lymphoma: Secondary | ICD-10-CM | POA: Diagnosis not present

## 2021-08-06 DIAGNOSIS — Z951 Presence of aortocoronary bypass graft: Secondary | ICD-10-CM | POA: Diagnosis not present

## 2021-08-06 DIAGNOSIS — Z7982 Long term (current) use of aspirin: Secondary | ICD-10-CM | POA: Diagnosis not present

## 2021-08-06 DIAGNOSIS — Z79899 Other long term (current) drug therapy: Secondary | ICD-10-CM | POA: Insufficient documentation

## 2021-08-06 DIAGNOSIS — K219 Gastro-esophageal reflux disease without esophagitis: Secondary | ICD-10-CM | POA: Diagnosis not present

## 2021-08-06 DIAGNOSIS — I2510 Atherosclerotic heart disease of native coronary artery without angina pectoris: Secondary | ICD-10-CM | POA: Insufficient documentation

## 2021-08-06 DIAGNOSIS — R0683 Snoring: Secondary | ICD-10-CM | POA: Diagnosis not present

## 2021-08-06 DIAGNOSIS — Z923 Personal history of irradiation: Secondary | ICD-10-CM | POA: Insufficient documentation

## 2021-08-06 DIAGNOSIS — Z9081 Acquired absence of spleen: Secondary | ICD-10-CM | POA: Insufficient documentation

## 2021-08-06 NOTE — Progress Notes (Addendum)
? ?[facility] NEW PATIENT NOTE ? ?Primary Care: Earney Mallet, MD ?Primary Cardiologist: Martin Majestic, MD ? ?HPI: ? ?76 y/o male [place] V[employer] with Agent Orange exposure with CAD s/p CABG 2011 followed bp PCI in 2018, GERD/PUD, Hodgkin's Lymphoma in 1980s s/p splenectomy and XRT (no chemo), bladder CA 2/22. ? ?Has been following with Dr. Alroy Dust. Recently has been feeling dizzy and fatigued. Dr. Alroy Dust sopped Flomax but no response. Went to [place] and they ordered echo and was told he had leaky valves ? ?In 2018 collapsed from heat stroke. Had cath as part of the w/u and received a stent.   ? ?Has had 9 BCG infusions. Self caths himself 4x/day.Gets 400cc of urine. No fevers or chills. No fevers or chills.  ? ?Denies CP or SOB. No edema, orthopnea or PND. Feels lightheaded when he stands. Snores a lot. (Refused sleep study in past as he won't wear CPAP)  ? ?Review of Systems: [y] = yes, '[ ]'$  = no  ? ?General: Weight gain '[ ]'$ ; Weight loss '[ ]'$ ; Anorexia '[ ]'$ ; Fatigue [ y]; Fever '[ ]'$ ; Chills '[ ]'$ ; Weakness '[ ]'$   ?Cardiac: Chest pain/pressure '[ ]'$ ; Resting SOB '[ ]'$ ; Exertional SOB '[ ]'$ ; Orthopnea '[ ]'$ ; Pedal Edema '[ ]'$ ; Palpitations '[ ]'$ ; Syncope '[ ]'$ ; Presyncope [ y]; Paroxysmal nocturnal dyspnea'[ ]'$   ?Pulmonary: Cough '[ ]'$ ; Wheezing'[ ]'$ ; Hemoptysis'[ ]'$ ; Sputum '[ ]'$ ; Snoring Blue.Reese ]  ?GI: Vomiting'[ ]'$ ; Dysphagia'[ ]'$ ; Melena'[ ]'$ ; Hematochezia '[ ]'$ ; Heartburn'[ ]'$ ; Abdominal pain '[ ]'$ ; Constipation '[ ]'$ ; Diarrhea '[ ]'$ ; BRBPR '[ ]'$   ?GU: Hematuria'[ ]'$ ; Dysuria '[ ]'$ ; Nocturia'[ ]'$   ?Vascular: Pain in legs with walking '[ ]'$ ; Pain in feet with lying flat '[ ]'$ ; Non-healing sores '[ ]'$ ; Stroke '[ ]'$ ; TIA '[ ]'$ ; Slurred speech '[ ]'$ ;  ?Neuro: Headaches'[ ]'$ ; Vertigo'[ ]'$ ; Seizures'[ ]'$ ; Paresthesias'[ ]'$ ;Blurred vision '[ ]'$ ; Diplopia '[ ]'$ ; Vision changes '[ ]'$   ?Ortho/Skin: Arthritis Blue.Reese ]; Joint pain Blue.Reese ]; Muscle pain '[ ]'$ ; Joint swelling '[ ]'$ ; Back Pain '[ ]'$ ; Rash '[ ]'$   ?Psych: Depression'[ ]'$ ; Anxiety'[ ]'$   ?Heme: Bleeding problems '[ ]'$ ; Clotting disorders '[ ]'$ ; Anemia '[ ]'$   ?Endocrine:  Diabetes '[ ]'$ ; Thyroid dysfunction'[ ]'$  ? ? ?Past Medical History:  ?Diagnosis Date  ? BPH (benign prostatic hyperplasia)   ? Coronary artery disease 2011  ? GERD (gastroesophageal reflux disease)   ? History of pulmonary embolus (PE)   ? After coronary artery bypass graft  ? Hodgkin disease ([place])   ? 1980s, status post splenectomy and radiation  ? HTN (hypertension)   ? Hyperlipidemia   ? ? ?Current Outpatient Medications  ?Medication Sig Dispense Refill  ? aspirin EC 81 MG tablet Take 81 mg by mouth daily.    ? esomeprazole (NEXIUM) 40 MG capsule Take 1 capsule by mouth twice daily 60 capsule 5  ? ibuprofen (ADVIL) 200 MG tablet Take 600 mg by mouth every 8 (eight) hours as needed (pain.).    ? metoprolol succinate (TOPROL-XL) 25 MG 24 hr tablet Take 25 mg by mouth daily.     ? nitroGLYCERIN (NITROSTAT) 0.4 MG SL tablet Place 0.4 mg under the tongue every 5 (five) minutes x 3 doses as needed for chest pain.     ? simvastatin (ZOCOR) 40 MG tablet Take 40 mg by mouth at bedtime.     ? sucralfate (CARAFATE) 1 g tablet Take 1 g by mouth daily. As needed    ? predniSONE (DELTASONE)  5 MG tablet Take 5 mg by mouth daily. (Patient not taking: Reported on 08/06/2021)  1  ? ?No current facility-administered medications for this encounter.  ? ? ?No Known Allergies ? ?  ?Social History  ? ?Socioeconomic History  ? Marital status: Married  ?  Spouse name: Not on file  ? Number of children: 1  ? Years of education: Not on file  ? Highest education level: Not on file  ?Occupational History  ? Occupation: Retired  ?  Comment: Retired Social research officer, government  ?Tobacco Use  ? Smoking status: Former  ?  Types: Cigars  ? Smokeless tobacco: Never  ?Vaping Use  ? Vaping Use: Never used  ?Substance and Sexual Activity  ? Alcohol use: Not Currently  ?  Alcohol/week: 0.0 standard drinks  ?  Comment: occassional; denied 06/10/20  ? Drug use: No  ? Sexual activity: Yes  ?  Partners: Female  ?  Comment: spouse  ?Other Topics Concern  ? Not on file  ?Social  History Narrative  ? Not on file  ? ?Social Determinants of Health  ? ?Financial Resource Strain: Not on file  ?Food Insecurity: Not on file  ?Transportation Needs: Not on file  ?Physical Activity: Not on file  ?Stress: Not on file  ?Social Connections: Not on file  ?Intimate Partner Violence: Not on file  ? ? ?  ?Family History  ?Problem Relation Age of Onset  ? Melanoma Maternal Grandmother   ? Congestive Heart Failure Mother   ? Deep vein thrombosis Mother   ? Bowel Disease Father   ?     Volvulus  ? Heart attack Brother   ? Diabetes Brother   ? ? ?Vitals:  ? 08/06/21 0958  ?BP: 116/62  ?Pulse: 80  ?SpO2: 95%  ?Weight: 89.8 kg (198 lb)  ? ? ?Orthostatics done personally  ? ?Sitting 140/68 ?Standing 118/66 ? ?PHYSICAL EXAM: ?General:  Well appearing. No respiratory difficulty ?HEENT: normal ?Neck: supple. no JVD. Carotids 2+ bilat; no bruits. No lymphadenopathy or thryomegaly appreciated. ?Cor: PMI nondisplaced. Regular rate & rhythm. Soft MR ?Lungs: clear ?Abdomen: soft, nontender, nondistended. No hepatosplenomegaly. No bruits or masses. Good bowel sounds. ?Extremities: no cyanosis, clubbing, rash, edema ?Neuro: alert & oriented x 3, cranial nerves grossly intact. moves all 4 extremities w/o difficulty. Affect pleasant. ? ?ECG: NSR 82 No ST-T wave abnormalities. Personally reviewed ? ?ASSESSMENT & PLAN:  ? ?1. Orthostatic hypotension ?- Encouraged liberalization of salt and fluid ?- Place compression stockings ?- Avoid midodrine for now if possible given resting HTN. If needs meds, will consider Florinef ? ?2. CAD ?- s/p CABG 2011 followed bp PCI in 2018 ?- no s/s angina ?- continue ASA/statin  ? ?3. Snoring  ?- suspect underlying OSA ?- refuses sleep study ? ?Glori Bickers, MD  ?1:25 PM ? ? ? ?

## 2021-08-06 NOTE — Patient Instructions (Signed)
Medication Changes: ? ?Continue current medications ? ?Lab Work: ? ?none ? ?Testing/Procedures: ? ?none ? ?Referrals: ? ?none ? ?Special Instructions // Education: ? ?Liberalize salt and fluid intake ?Please wear your compression hose daily, place them on as soon as you get up in the morning and remove before you go to bed at night. ?Please get [place] a copy of your echocardiogram, it can be dropped off at front desk or faxed to [place] at [phone] ? ?Follow-Up in: 3 months ? ?At the [facility], you and your health needs are our priority. We have a designated team specialized in the treatment of Heart Failure. This Care Team includes your primary Heart Failure Specialized Cardiologist (physician), Advanced Practice Providers (APPs- Physician Assistants and Nurse Practitioners), and Pharmacist who all work together to provide you with the care you need, when you need it.  ? ?You may see any of the following providers on your designated Care Team at your next follow up: ? ?Dr Glori Bickers ?Dr Loralie Champagne ?Darrick Grinder, NP ?Lyda Jester, PA ?Jessica Milford,NP ?Marlyce Huge, PA ?Audry Riles, PharmD ? ? ?Please be sure to bring in all your medications bottles to every appointment.  ? ?Need to Contact us: ? ?If you have any questions or concerns before your next appointment please send [place] a message through [place] or call our office at [phone].   ? ?TO LEAVE A MESSAGE FOR THE NURSE SELECT OPTION 2, PLEASE LEAVE A MESSAGE INCLUDING: ?YOUR NAME ?DATE OF BIRTH ?CALL BACK NUMBER ?REASON FOR CALL**this is important as we prioritize the call backs ? ?YOU WILL RECEIVE A CALL BACK THE SAME DAY AS LONG AS YOU CALL BEFORE 4:00 PM ? ? ?

## 2021-08-31 ENCOUNTER — Encounter (HOSPITAL_COMMUNITY): Payer: Self-pay

## 2021-08-31 ENCOUNTER — Encounter (HOSPITAL_COMMUNITY)
Admission: RE | Admit: 2021-08-31 | Discharge: 2021-08-31 | Disposition: A | Discharge status: Home / Self Care | Payer: Medicare Other | Source: Ambulatory Visit | Attending: Internal Medicine | Admitting: Internal Medicine

## 2021-08-31 ENCOUNTER — Other Ambulatory Visit: Payer: Self-pay

## 2021-08-31 NOTE — Patient Instructions (Signed)
Matthew Butler  08/31/2021     '@PREFPERIOPPHARMACY'$ @   Your procedure is scheduled on  09/02/2021.   Report to [facility] at  0830  A.M.   Call this number if you have problems the morning of surgery:  [phone]   Remember:  Follow the diet and prep instructions given to you by the office.    Take these medicines the morning of surgery with A SIP OF WATER                                   nexium ,metoprolol.     Do not wear jewelry, make-up or nail polish.  Do not wear lotions, powders, or perfumes, or deodorant.  Do not shave 48 hours prior to surgery.  Men may shave face and neck.  Do not bring valuables to the hospital.  [facility] is not responsible for any belongings or valuables.  Contacts, dentures or bridgework may not be worn into surgery.  Leave your suitcase in the car.  After surgery it may be brought to your room.  For patients admitted to the hospital, discharge time will be determined by your treatment team.  Patients discharged the day of surgery will not be allowed to drive home and must have someone with them for 24 hours.    Special instructions:   DO NOT smoke tobacco or vape for 24 hours before your procedure.  Please read over the following fact sheets that you were given. Anesthesia Post-op Instructions and Care and Recovery After Surgery      Upper Endoscopy, Adult, Care After This sheet gives you information about how to care for yourself after your procedure. Your health care provider may also give you more specific instructions. If you have problems or questions, contact your health care provider. What can I expect after the procedure? After the procedure, it is common to have: A sore throat. Mild stomach pain or discomfort. Bloating. Nausea. Follow these instructions at home:  Follow instructions from your health care provider about what to eat or drink after your procedure. Return to your normal activities as told by your  health care provider. Ask your health care provider what activities are safe for you. Take over-the-counter and prescription medicines only as told by your health care provider. If you were given a sedative during the procedure, it can affect you for several hours. Do not drive or operate machinery until your health care provider says that it is safe. Keep all follow-up visits as told by your health care provider. This is important. Contact a health care provider if you have: A sore throat that lasts longer than one day. Trouble swallowing. Get help right away if: You vomit blood or your vomit looks like coffee grounds. You have: A fever. Bloody, black, or tarry stools. A severe sore throat or you cannot swallow. Difficulty breathing. Severe pain in your chest or abdomen. Summary After the procedure, it is common to have a sore throat, mild stomach discomfort, bloating, and nausea. If you were given a sedative during the procedure, it can affect you for several hours. Do not drive or operate machinery until your health care provider says that it is safe. Follow instructions from your health care provider about what to eat or drink after your procedure. Return to your normal activities as told by your health care provider. This information is  not intended to replace advice given to you by your health care provider. Make sure you discuss any questions you have with your health care provider. Document Revised: 01/25/2019 Document Reviewed: 08/21/2017 Elsevier Patient Education  [place]. Colonoscopy, Adult, Care After The following information offers guidance on how to care for yourself after your procedure. Your health care provider may also give you more specific instructions. If you have problems or questions, contact your health care provider. What can I expect after the procedure? After the procedure, it is common to have: A small amount of blood in your stool for 24 hours  after the procedure. Some gas. Mild cramping or bloating of your abdomen. Follow these instructions at home: Eating and drinking  Drink enough fluid to keep your urine pale yellow. Follow instructions from your health care provider about eating or drinking restrictions. Resume your normal diet as told by your health care provider. Avoid heavy or fried foods that are hard to digest. Activity Rest as told by your health care provider. Avoid sitting for a long time without moving. Get up to take short walks every 1-2 hours. This is important to improve blood flow and breathing. Ask for help if you feel weak or unsteady. Return to your normal activities as told by your health care provider. Ask your health care provider what activities are safe for you. Managing cramping and bloating  Try walking around when you have cramps or feel bloated. If directed, apply heat to your abdomen as told by your health care provider. Use the heat source that your health care provider recommends, such as a moist heat pack or a heating pad. Place a towel between your skin and the heat source. Leave the heat on for 20-30 minutes. Remove the heat if your skin turns bright red. This is especially important if you are unable to feel pain, heat, or cold. You have a greater risk of getting burned. General instructions If you were given a sedative during the procedure, it can affect you for several hours. Do not drive or operate machinery until your health care provider says that it is safe. For the first 24 hours after the procedure: Do not sign important documents. Do not drink alcohol. Do your regular daily activities at a slower pace than normal. Eat soft foods that are easy to digest. Take over-the-counter and prescription medicines only as told by your health care provider. Keep all follow-up visits. This is important. Contact a health care provider if: You have blood in your stool 2-3 days after the  procedure. Get help right away if: You have more than a small spotting of blood in your stool. You have large blood clots in your stool. You have swelling of your abdomen. You have nausea or vomiting. You have a fever. You have increasing pain in your abdomen that is not relieved with medicine. These symptoms may be an emergency. Get help right away. Call 911. Do not wait to see if the symptoms will go away. Do not drive yourself to the hospital. Summary After the procedure, it is common to have a small amount of blood in your stool. You may also have mild cramping and bloating of your abdomen. If you were given a sedative during the procedure, it can affect you for several hours. Do not drive or operate machinery until your health care provider says that it is safe. Get help right away if you have a lot of blood in your stool, nausea or  vomiting, a fever, or increased pain in your abdomen. This information is not intended to replace advice given to you by your health care provider. Make sure you discuss any questions you have with your health care provider. Document Revised: 11/11/2020 Document Reviewed: 11/11/2020 Elsevier Patient Education  [place] After This sheet gives you information about how to care for yourself after your procedure. Your health care provider may also give you more specific instructions. If you have problems or questions, contact your health care provider. What can I expect after the procedure? After the procedure, it is common to have: Tiredness. Forgetfulness about what happened after the procedure. Impaired judgment for important decisions. Nausea or vomiting. Some difficulty with balance. Follow these instructions at home: For the time period you were told by your health care provider:     Rest as needed. Do not participate in activities where you could fall or become injured. Do not drive or use machinery. Do  not drink alcohol. Do not take sleeping pills or medicines that cause drowsiness. Do not make important decisions or sign legal documents. Do not take care of children on your own. Eating and drinking Follow the diet that is recommended by your health care provider. Drink enough fluid to keep your urine pale yellow. If you vomit: Drink water, juice, or soup when you can drink without vomiting. Make sure you have little or no nausea before eating solid foods. General instructions Have a responsible adult stay with you for the time you are told. It is important to have someone help care for you until you are awake and alert. Take over-the-counter and prescription medicines only as told by your health care provider. If you have sleep apnea, surgery and certain medicines can increase your risk for breathing problems. Follow instructions from your health care provider about wearing your sleep device: Anytime you are sleeping, including during daytime naps. While taking prescription pain medicines, sleeping medicines, or medicines that make you drowsy. Avoid smoking. Keep all follow-up visits as told by your health care provider. This is important. Contact a health care provider if: You keep feeling nauseous or you keep vomiting. You feel light-headed. You are still sleepy or having trouble with balance after 24 hours. You develop a rash. You have a fever. You have redness or swelling around the IV site. Get help right away if: You have trouble breathing. You have new-onset confusion at home. Summary For several hours after your procedure, you may feel tired. You may also be forgetful and have poor judgment. Have a responsible adult stay with you for the time you are told. It is important to have someone help care for you until you are awake and alert. Rest as told. Do not drive or operate machinery. Do not drink alcohol or take sleeping pills. Get help right away if you have trouble  breathing, or if you suddenly become confused. This information is not intended to replace advice given to you by your health care provider. Make sure you discuss any questions you have with your health care provider. Document Revised: 02/23/2021 Document Reviewed: 02/21/2019 Elsevier Patient Education  [place].

## 2021-09-02 ENCOUNTER — Ambulatory Visit (HOSPITAL_BASED_OUTPATIENT_CLINIC_OR_DEPARTMENT_OTHER): Payer: Medicare Other | Admitting: Anesthesiology

## 2021-09-02 ENCOUNTER — Ambulatory Visit (HOSPITAL_COMMUNITY)
Admission: RE | Admit: 2021-09-02 | Discharge: 2021-09-02 | Disposition: A | Discharge status: Home / Self Care | Payer: Medicare Other | Source: Ambulatory Visit | Attending: Internal Medicine | Admitting: Internal Medicine

## 2021-09-02 ENCOUNTER — Encounter (HOSPITAL_COMMUNITY)
Admission: RE | Disposition: A | Discharge status: Home / Self Care | Payer: Self-pay | Source: Ambulatory Visit | Attending: Internal Medicine

## 2021-09-02 ENCOUNTER — Ambulatory Visit (HOSPITAL_COMMUNITY): Payer: Medicare Other | Admitting: Anesthesiology

## 2021-09-02 ENCOUNTER — Encounter (HOSPITAL_COMMUNITY): Payer: Self-pay | Admitting: Internal Medicine

## 2021-09-02 ENCOUNTER — Other Ambulatory Visit: Payer: Self-pay

## 2021-09-02 DIAGNOSIS — Z87891 Personal history of nicotine dependence: Secondary | ICD-10-CM | POA: Diagnosis not present

## 2021-09-02 DIAGNOSIS — K5730 Diverticulosis of large intestine without perforation or abscess without bleeding: Secondary | ICD-10-CM

## 2021-09-02 DIAGNOSIS — K449 Diaphragmatic hernia without obstruction or gangrene: Secondary | ICD-10-CM | POA: Diagnosis not present

## 2021-09-02 DIAGNOSIS — Z1211 Encounter for screening for malignant neoplasm of colon: Secondary | ICD-10-CM | POA: Diagnosis present

## 2021-09-02 DIAGNOSIS — K635 Polyp of colon: Secondary | ICD-10-CM | POA: Insufficient documentation

## 2021-09-02 DIAGNOSIS — Z86010 Personal history of colonic polyps: Secondary | ICD-10-CM | POA: Diagnosis not present

## 2021-09-02 DIAGNOSIS — K640 First degree hemorrhoids: Secondary | ICD-10-CM

## 2021-09-02 DIAGNOSIS — K219 Gastro-esophageal reflux disease without esophagitis: Secondary | ICD-10-CM | POA: Insufficient documentation

## 2021-09-02 DIAGNOSIS — I10 Essential (primary) hypertension: Secondary | ICD-10-CM | POA: Insufficient documentation

## 2021-09-02 DIAGNOSIS — Z951 Presence of aortocoronary bypass graft: Secondary | ICD-10-CM | POA: Diagnosis not present

## 2021-09-02 DIAGNOSIS — Z09 Encounter for follow-up examination after completed treatment for conditions other than malignant neoplasm: Secondary | ICD-10-CM | POA: Diagnosis not present

## 2021-09-02 DIAGNOSIS — R1013 Epigastric pain: Secondary | ICD-10-CM

## 2021-09-02 DIAGNOSIS — I2510 Atherosclerotic heart disease of native coronary artery without angina pectoris: Secondary | ICD-10-CM | POA: Diagnosis not present

## 2021-09-02 HISTORY — PX: COLONOSCOPY WITH PROPOFOL: SHX5780

## 2021-09-02 HISTORY — PX: POLYPECTOMY: SHX149

## 2021-09-02 HISTORY — PX: ESOPHAGOGASTRODUODENOSCOPY (EGD) WITH PROPOFOL: SHX5813

## 2021-09-02 SURGERY — COLONOSCOPY WITH PROPOFOL
Anesthesia: General

## 2021-09-02 MED ORDER — PHENYLEPHRINE 80 MCG/ML (10ML) SYRINGE FOR IV PUSH (FOR BLOOD PRESSURE SUPPORT)
PREFILLED_SYRINGE | INTRAVENOUS | Status: DC | PRN
  Administered 2021-09-02 (×2): 80 ug via INTRAVENOUS

## 2021-09-02 MED ORDER — STERILE WATER FOR IRRIGATION IR SOLN
Status: DC | PRN
  Administered 2021-09-02: .4 mL

## 2021-09-02 MED ORDER — PHENYLEPHRINE 80 MCG/ML (10ML) SYRINGE FOR IV PUSH (FOR BLOOD PRESSURE SUPPORT)
PREFILLED_SYRINGE | INTRAVENOUS | Status: AC
  Filled 2021-09-02: qty 10

## 2021-09-02 MED ORDER — PROPOFOL 10 MG/ML IV BOLUS
INTRAVENOUS | Status: DC | PRN
  Administered 2021-09-02 (×2): 20 mg via INTRAVENOUS
  Administered 2021-09-02: 100 mg via INTRAVENOUS

## 2021-09-02 MED ORDER — LIDOCAINE HCL 1 % IJ SOLN
INTRAMUSCULAR | Status: DC | PRN
  Administered 2021-09-02: 50 mg via INTRADERMAL

## 2021-09-02 MED ORDER — PROPOFOL 500 MG/50ML IV EMUL
INTRAVENOUS | Status: DC | PRN
  Administered 2021-09-02: 150 ug/kg/min via INTRAVENOUS

## 2021-09-02 MED ORDER — LACTATED RINGERS IV SOLN: INTRAVENOUS | Status: DC | PRN

## 2021-09-02 NOTE — Op Note (Signed)
[facility] Patient Name: Matthew Butler Procedure Date: 09/02/2021 9:51 AM MRN: [id number] Date of Birth: [birth year] Attending MD: Norvel Richards , MD CSN: [id number] Age: 76 Admit Type: Outpatient Procedure:                Upper GI endoscopy Indications:              Epigastric abdominal pain Providers:                Norvel Richards, MD, Crystal Page, Raphael Gibney, Technician Referring MD:              Medicines:                Propofol per Anesthesia Complications:            No immediate complications. Estimated Blood Loss:     Estimated blood loss: none. Procedure:                Pre-Anesthesia Assessment:                           - Prior to the procedure, a History and Physical                            was performed, and patient medications and                            allergies were reviewed. The patient's tolerance of                            previous anesthesia was also reviewed. The risks                            and benefits of the procedure and the sedation                            options and risks were discussed with the patient.                            All questions were answered, and informed consent                            was obtained. Prior Anticoagulants: The patient has                            taken no previous anticoagulant or antiplatelet                            agents. ASA Grade Assessment: III - A patient with                            severe systemic disease. After reviewing the risks  and benefits, the patient was deemed in                            satisfactory condition to undergo the procedure.                           After obtaining informed consent, the endoscope was                            passed under direct vision. Throughout the                            procedure, the patient's blood pressure, pulse, and                            oxygen saturations  were monitored continuously. The                            [id number]-H190 ([id number]) scope was introduced through the                            mouth, and advanced to the second part of duodenum.                            The upper GI endoscopy was accomplished without                            difficulty. The patient tolerated the procedure                            well. Scope In: 10:12:00 AM Scope Out: 10:16:02 AM Total Procedure Duration: 0 hours 4 minutes 2 seconds  Findings:      The examined esophagus was normal.      A medium-sized hiatal hernia was present. Gastric mucosa appeared       normal. Patent pylorus.      The duodenal bulb and second portion of the duodenum were normal. Impression:               - Normal esophagus.                           - Medium-sized hiatal hernia.                           - Normal duodenal bulb and second portion of the                            duodenum.                           - No specimens collected. Moderate Sedation:      Moderate (conscious) sedation was personally administered by an       anesthesia professional. The following parameters were monitored: oxygen       saturation, heart rate, blood pressure, respiratory rate, EKG, adequacy       of pulmonary  ventilation, and response to care. Recommendation:           - Patient has a contact number available for                            emergencies. The signs and symptoms of potential                            delayed complications were discussed with the                            patient. Return to normal activities tomorrow.                            Written discharge instructions were provided to the                            patient.                           - Resume previous diet.                           - Continue present medications.                           - Return to my office in 4 months. See colonoscopy                            report. Procedure Code(s):         --- Professional ---                           [phone], Esophagogastroduodenoscopy, flexible,                            transoral; diagnostic, including collection of                            specimen(s) by brushing or washing, when performed                            (separate procedure) Diagnosis Code(s):        --- Professional ---                           K44.9, Diaphragmatic hernia without obstruction or                            gangrene                           R10.13, Epigastric pain CPT copyright 2019 American Medical Association. All rights reserved. The codes documented in this report are preliminary and upon coder review may  be revised to meet current compliance requirements. [name]. Safia Panzer, MD Norvel Richards, MD 09/02/2021 10:20:35 AM This report has been signed electronically. Number of Addenda: 0

## 2021-09-02 NOTE — Discharge Instructions (Signed)
Colonoscopy Discharge Instructions  Read the instructions outlined below and refer to this sheet in the next few weeks. These discharge instructions provide you with general information on caring for yourself after you leave the hospital. Your doctor may also give you specific instructions. While your treatment has been planned according to the most current medical practices available, unavoidable complications occasionally occur. If you have any problems or questions after discharge, call Dr. Gala Romney at [phone]. ACTIVITY You may resume your regular activity, but move at a slower pace for the next 24 hours.  Take frequent rest periods for the next 24 hours.  Walking will help get rid of the air and reduce the bloated feeling in your belly (abdomen).  No driving for 24 hours (because of the medicine (anesthesia) used during the test).   Do not sign any important legal documents or operate any machinery for 24 hours (because of the anesthesia used during the test).  NUTRITION Drink plenty of fluids.  You may resume your normal diet as instructed by your doctor.  Begin with a light meal and progress to your normal diet. Heavy or fried foods are harder to digest and may make you feel sick to your stomach (nauseated).  Avoid alcoholic beverages for 24 hours or as instructed.  MEDICATIONS You may resume your normal medications unless your doctor tells you otherwise.  WHAT YOU CAN EXPECT TODAY Some feelings of bloating in the abdomen.  Passage of more gas than usual.  Spotting of blood in your stool or on the toilet paper.  IF YOU HAD POLYPS REMOVED DURING THE COLONOSCOPY: No aspirin products for 7 days or as instructed.  No alcohol for 7 days or as instructed.  Eat a soft diet for the next 24 hours.  FINDING OUT THE RESULTS OF YOUR TEST Not all test results are available during your visit. If your test results are not back during the visit, make an appointment with your caregiver to find out the  results. Do not assume everything is normal if you have not heard from your caregiver or the medical facility. It is important for you to follow up on all of your test results.  SEEK IMMEDIATE MEDICAL ATTENTION IF: You have more than a spotting of blood in your stool.  Your belly is swollen (abdominal distention).  You are nauseated or vomiting.  You have a temperature over 101.  You have abdominal pain or discomfort that is severe or gets worse throughout the day.   EGD Discharge instructions Please read the instructions outlined below and refer to this sheet in the next few weeks. These discharge instructions provide you with general information on caring for yourself after you leave the hospital. Your doctor may also give you specific instructions. While your treatment has been planned according to the most current medical practices available, unavoidable complications occasionally occur. If you have any problems or questions after discharge, please call your doctor. ACTIVITY You may resume your regular activity but move at a slower pace for the next 24 hours.  Take frequent rest periods for the next 24 hours.  Walking will help expel (get rid of) the air and reduce the bloated feeling in your abdomen.  No driving for 24 hours (because of the anesthesia (medicine) used during the test).  You may shower.  Do not sign any important legal documents or operate any machinery for 24 hours (because of the anesthesia used during the test).  NUTRITION Drink plenty of fluids.  You may  resume your normal diet.  Begin with a light meal and progress to your normal diet.  Avoid alcoholic beverages for 24 hours or as instructed by your caregiver.  MEDICATIONS You may resume your normal medications unless your caregiver tells you otherwise.  WHAT YOU CAN EXPECT TODAY You may experience abdominal discomfort such as a feeling of fullness or "gas" pains.  FOLLOW-UP Your doctor will discuss the results of  your test with you.  SEEK IMMEDIATE MEDICAL ATTENTION IF ANY OF THE FOLLOWING OCCUR: Excessive nausea (feeling sick to your stomach) and/or vomiting.  Severe abdominal pain and distention (swelling).  Trouble swallowing.  Temperature over 101 F (37.8 C).  Rectal bleeding or vomiting of blood.    No stomach ulcer found today.  You have a hiatal hernia   Which is not a problem for you   4 polyps removed from your colon  Colon polyp and diverticulosis information provided   further recommendations to follow pending review of pathology report   at patient request, I called Thayer Headings at [phone] -  rolled to voicemail.-Left a detailed message.

## 2021-09-02 NOTE — Anesthesia Preprocedure Evaluation (Signed)
Anesthesia Evaluation  ?Patient identified by MRN, date of birth, ID band ?Patient awake ? ? ? ?Reviewed: ?Allergy & Precautions, H&P , NPO status , Patient's Chart, lab work & pertinent test results, reviewed documented beta blocker date and time  ? ?Airway ?Mallampati: II ? ?TM Distance: >3 FB ?Neck ROM: full ? ? ? Dental ?no notable dental hx. ? ?  ?Pulmonary ?neg pulmonary ROS, former smoker,  ?  ?Pulmonary exam normal ?breath sounds clear to auscultation ? ? ? ? ? ? Cardiovascular ?Exercise Tolerance: Good ?hypertension, + CAD and + CABG  ? ?Rhythm:regular Rate:Normal ? ? ?  ?Neuro/Psych ?negative neurological ROS ? negative psych ROS  ? GI/Hepatic ?Neg liver ROS, GERD  Medicated,  ?Endo/Other  ?negative endocrine ROS ? Renal/GU ?negative Renal ROS  ?negative genitourinary ?  ?Musculoskeletal ? ? Abdominal ?  ?Peds ? Hematology ?negative hematology ROS ?(+)   ?Anesthesia Other Findings ? ? Reproductive/Obstetrics ?negative OB ROS ? ?  ? ? ? ? ? ? ? ? ? ? ? ? ? ?  ?  ? ? ? ? ? ? ? ? ?Anesthesia Physical ?Anesthesia Plan ? ?ASA: 3 ? ?Anesthesia Plan: General  ? ?Post-op Pain Management:   ? ?Induction:  ? ?PONV Risk Score and Plan: Propofol infusion ? ?Airway Management Planned:  ? ?Additional Equipment:  ? ?Intra-op Plan:  ? ?Post-operative Plan:  ? ?Informed Consent: I have reviewed the patients History and Physical, chart, labs and discussed the procedure including the risks, benefits and alternatives for the proposed anesthesia with the patient or authorized representative who has indicated his/her understanding and acceptance.  ? ? ? ?Dental Advisory Given ? ?Plan Discussed with: CRNA ? ?Anesthesia Plan Comments:   ? ? ? ? ? ? ?Anesthesia Quick Evaluation ? ?

## 2021-09-02 NOTE — Op Note (Signed)
[facility] Patient Name: Matthew Butler Procedure Date: 09/02/2021 9:50 AM MRN: [id number] Date of Birth: April 16, 1945 Attending MD: Norvel Richards , MD CSN: [id number] Age: 76 Admit Type: Outpatient Procedure:                Colonoscopy Indications:              High risk colon cancer surveillance: Personal                            history of colonic polyps Providers:                Norvel Richards, MD, Crystal Page, Raphael Gibney, Technician Referring MD:              Medicines:                Propofol per Anesthesia Complications:            No immediate complications. Estimated Blood Loss:     Estimated blood loss was minimal. Procedure:                Pre-Anesthesia Assessment:                           - Prior to the procedure, a History and Physical                            was performed, and patient medications and                            allergies were reviewed. The patient's tolerance of                            previous anesthesia was also reviewed. The risks                            and benefits of the procedure and the sedation                            options and risks were discussed with the patient.                            All questions were answered, and informed consent                            was obtained. Prior Anticoagulants: The patient has                            taken no previous anticoagulant or antiplatelet                            agents. ASA Grade Assessment: III - A patient with  severe systemic disease. After reviewing the risks                            and benefits, the patient was deemed in                            satisfactory condition to undergo the procedure.                           After obtaining informed consent, the colonoscope                            was passed under direct vision. Throughout the                            procedure, the patient's  blood pressure, pulse, and                            oxygen saturations were monitored continuously. The                            [id number]) scope was introduced through the                            anus and advanced to the the cecum, identified by                            appendiceal orifice and ileocecal valve. The                            colonoscopy was performed without difficulty. The                            patient tolerated the procedure well. The quality                            of the bowel preparation was adequate. Scope In: 10:22:00 AM Scope Out: 10:47:28 AM Total Procedure Duration: 0 hours 25 minutes 28 seconds  Findings:      The perianal and digital rectal examinations were normal.      Four semi-pedunculated polyps were found in the sigmoid colon and       splenic flexure. The polyps were 4 to 6 mm in size. These polyps were       removed with a cold snare. Resection and retrieval were complete.       Estimated blood loss was minimal.      Many small and large-mouthed diverticula were found in the sigmoid colon       and descending colon.      Non-bleeding internal hemorrhoids were found during retroflexion. The       hemorrhoids were moderate, medium-sized and Grade I (internal       hemorrhoids that do not prolapse).      The exam was otherwise without abnormality on direct and retroflexion       views. Impression:               -  Four 4 to 6 mm polyps in the sigmoid colon and at                            the splenic flexure, removed with a cold snare.                            Resected and retrieved.                           - Diverticulosis in the sigmoid colon and in the                            descending colon.                           - Non-bleeding internal hemorrhoids.                           - The examination was otherwise normal on direct                            and retroflexion views. Moderate Sedation:      Moderate  (conscious) sedation was personally administered by an       anesthesia professional. The following parameters were monitored: oxygen       saturation, heart rate, blood pressure, respiratory rate, EKG, adequacy       of pulmonary ventilation, and response to care. Recommendation:           - Patient has a contact number available for                            emergencies. The signs and symptoms of potential                            delayed complications were discussed with the                            patient. Return to normal activities tomorrow.                            Written discharge instructions were provided to the                            patient.                           - Resume previous diet.                           - Continue present medications.                           - Repeat colonoscopy date to be determined after                            pending pathology results are reviewed  for                            surveillance.                           - Return to GI office (date not yet determined). Procedure Code(s):        --- Professional ---                           [phone], Colonoscopy, flexible; with removal of                            tumor(s), polyp(s), or other lesion(s) by snare                            technique Diagnosis Code(s):        --- Professional ---                           Z86.010, Personal history of colonic polyps                           K63.5, Polyp of colon                           K64.0, First degree hemorrhoids                           K57.30, Diverticulosis of large intestine without                            perforation or abscess without bleeding CPT copyright 2019 American Medical Association. All rights reserved. The codes documented in this report are preliminary and upon coder review may  be revised to meet current compliance requirements. [name]. [name], MD Norvel Richards, MD 09/02/2021 10:52:52 AM This  report has been signed electronically. Number of Addenda: 0

## 2021-09-02 NOTE — Anesthesia Procedure Notes (Signed)
Date/Time: 09/02/2021 10:14 AM Performed by: Orlie Dakin, CRNA Pre-anesthesia Checklist: Patient identified, Emergency Drugs available, Suction available and Patient being monitored Patient Re-evaluated:Patient Re-evaluated prior to induction Oxygen Delivery Method: Nasal cannula Induction Type: IV induction Placement Confirmation: positive ETCO2

## 2021-09-02 NOTE — H&P (Signed)
$'@LOGO'Y$ @   Primary Care Physician:  Earney Mallet, MD Primary Gastroenterologist:  Dr. Gala Romney  Pre-Procedure History & Physical: HPI:  Matthew Butler is a 76 y.o. male here for For evaluation of early satiety epigastric pain weight loss via EGD and surveillance colonoscopy-history of colonic adenoma.   Ibuprofen use.  History of peptic ulcer disease.  No dysphagia. 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  Numerous tubular adenomas removed.  Surveillance colonoscopy advised for November 2022.   CORONARY ANGIOPLASTY WITH STENT PLACEMENT  2018   CORONARY ARTERY BYPASS GRAFT  2011   ESOPHAGOGASTRODUODENOSCOPY N/A 03/06/2020   Procedure: ESOPHAGOGASTRODUODENOSCOPY (EGD);  Surgeon: [name], MD;  Location: [facility];  Service: Endoscopy;  Laterality: N/A;  11:00am   KNEE ARTHROPLASTY     MELANOMA EXCISION  2016   Left face   POLYPECTOMY  02/28/2018   Procedure: POLYPECTOMY;  Surgeon: [name], MD;  Location: [facility];  Service: Endoscopy;;  (colon)   SPLENECTOMY  1980   For Hodgkin's lymphoma    Prior to Admission medications   Medication Sig Start Date End Date Taking? Authorizing Provider  aspirin EC 81 MG tablet Take 81 mg by mouth daily.   Yes [provider]   esomeprazole (NEXIUM) 40 MG capsule Take 1 capsule by mouth twice daily 01/28/21  Yes Mahala Menghini, PA-C  metoprolol succinate (TOPROL-XL) 25 MG 24 hr tablet Take 25 mg by mouth daily.  02/24/14  Yes [provider]  simvastatin (ZOCOR) 40 MG tablet Take 40 mg by mouth at bedtime.  02/24/14  Yes [provider]  sucralfate (CARAFATE) 1 g tablet Take 1 g by mouth daily as needed (acid reflux). 04/16/21  Yes [provider]  ibuprofen (ADVIL) 200 MG tablet Take 400-600 mg by mouth every 6 (six) hours as needed for moderate pain.    [provider]  nitroGLYCERIN (NITROSTAT) 0.4 MG SL tablet Place 0.4 mg under the tongue every 5 (five) minutes x 3 doses as needed for chest pain.     [provider]  predniSONE (DELTASONE) 5 MG tablet Take 5 mg by mouth daily. 01/29/18   [provider]    Allergies as of 08/05/2021   (No Known Allergies)    Family History  Problem Relation Age of Onset   Melanoma Maternal Grandmother    Congestive Heart Failure Mother    Deep vein thrombosis Mother    Bowel Disease Father        Volvulus   Heart attack Brother    Diabetes Brother     Social History   Socioeconomic History   Marital status: Married    Spouse name: Not on file   Number of children: 1  Years of education: Not on file   Highest education level: Not on file  Occupational History   Occupation: Retired    Comment: Retired Social research officer, government  Tobacco Use   Smoking status: Former    Types: Cigars   Smokeless tobacco: Never  Vaping Use   Vaping Use: Never used  Substance and Sexual Activity   Alcohol use: Not Currently    Alcohol/week: 0.0 standard drinks    Comment: occassional; denied 06/10/20   Drug use: No   Sexual activity: Yes    Partners: Female    Comment: spouse  Other Topics Concern   Not on file  Social History Narrative   Not on file   Social Determinants of Health   Financial Resource Strain: Not on file  Food  Insecurity: Not on file  Transportation Needs: Not on file  Physical Activity: Not on file  Stress: Not on file  Social Connections: Not on file  Intimate Partner Violence: Not on file    Review of Systems: See HPI, otherwise negative ROS  Physical Exam: BP 140/70 (BP Location: Right Arm)   Pulse 85   Temp 98.2 F (36.8 C)   Resp 18   SpO2 97%  General:   Alert,  Well-developed, well-nourished, pleasant and cooperative in NAD thy. Lungs:  Clear throughout to auscultation.   No wheezes, crackles, or rhonchi. No acute distress. Heart:  Regular rate and rhythm; no murmurs, clicks, rubs,  or gallops. Abdomen: Non-distended, normal bowel sounds.  Soft and nontender without appreciable mass or hepatosplenomegaly.  Pulses:  Normal pulses noted. Extremities:  Without clubbing or edema.  Impression/Plan:    76 year old gentleman with epigastric pain early satiety weight loss.  History of peptic ulcer disease.  History of NSAID use here for an EGD to further evaluate.    Also, here for surveillance colonoscopy-history colonic adenoma  Prostate abnormal on CT PSA slightly elevated at 5.3.  I have previously recommended patient see the urologist.  He has not done so as of yet.  I have reemphasized the importance of touching base with the urologist about the above mentioned abnormalities.  The risks, benefits, limitations, imponderables and alternatives regarding both EGD and colonoscopy have been reviewed with the patient. Questions have been answered. All parties agreeable.       Notice: This dictation was prepared with Dragon dictation along with smaller phrase technology. Any transcriptional errors that result from this process are unintentional and may not be corrected upon review.

## 2021-09-02 NOTE — Transfer of Care (Signed)
Immediate Anesthesia Transfer of Care Note  Patient: Matthew Butler  Procedure(s) Performed: COLONOSCOPY WITH PROPOFOL ESOPHAGOGASTRODUODENOSCOPY (EGD) WITH PROPOFOL POLYPECTOMY INTESTINAL  Patient Location: Short Stay  Anesthesia Type:General  Level of Consciousness: awake  Airway & Oxygen Therapy: Patient Spontanous Breathing  Post-op Assessment: Report given to RN  Post vital signs: Reviewed and stable  Last Vitals:  Vitals Value Taken Time  BP    Temp    Pulse    Resp    SpO2      Last Pain:  Vitals:   09/02/21 1007  PainSc: 0-No pain         Complications: No notable events documented.

## 2021-09-02 NOTE — Anesthesia Postprocedure Evaluation (Signed)
Anesthesia Post Note  Patient: Matthew Butler  Procedure(s) Performed: COLONOSCOPY WITH PROPOFOL ESOPHAGOGASTRODUODENOSCOPY (EGD) WITH PROPOFOL POLYPECTOMY INTESTINAL  Patient location during evaluation: Short Stay Anesthesia Type: General Level of consciousness: awake and alert Pain management: pain level controlled Vital Signs Assessment: post-procedure vital signs reviewed and stable Respiratory status: spontaneous breathing Cardiovascular status: blood pressure returned to baseline and stable Postop Assessment: no apparent nausea or vomiting Anesthetic complications: no   No notable events documented.   Last Vitals:  Vitals:   09/02/21 0845  BP: 140/70  Pulse: 85  Resp: 18  Temp: 36.8 C  SpO2: 97%    Last Pain:  Vitals:   09/02/21 1007  PainSc: 0-No pain                 Londin Antone

## 2021-09-03 ENCOUNTER — Encounter: Payer: Self-pay | Admitting: Internal Medicine

## 2021-09-03 LAB — SURGICAL PATHOLOGY

## 2021-09-10 ENCOUNTER — Encounter (HOSPITAL_COMMUNITY): Payer: Self-pay | Admitting: Internal Medicine

## 2021-11-14 NOTE — Progress Notes (Signed)
[facility] CLINIC NOTE  Primary Care: Matthew Mallet, MD Primary Cardiologist: Martin Majestic, MD  HPI:  [name] is a 76 y/o male [place] Vet with Agent Orange exposure with CAD s/p CABG 2011 followed bp PCI in 2018, GERD/PUD, Hodgkin's Lymphoma in 1980s s/p splenectomy and XRT (no chemo), bladder CA 2/22.  Has been following with Dr. Alroy Dust. Recently has been feeling dizzy and fatigued. Dr. Alroy Dust stopped Flomax but no response. Went to [place] and they ordered echo and was told he had leaky valves  In 2018 collapsed from heat stroke. Had cath as part of the w/u and received a stent.    Has had 9 BCG infusions. Self caths himself 4x/day.Gets 400cc of urine. No fevers or chills. No fevers or chills.   I saw him for first time in 5/23 at the request of Mr. Flonnie Overman. I felt he had orthostatic hypotension. Encouraged liberalization of salt and fluid and compression stockings. Trying to avoid midodrine for now if possible given resting HTN.  Here for f/u. Says he feels better. Still fatigues easily but dizzy spells are much less. Wears compression stockings on occasions. No palpitations. Drinking more fluids. If doesn't drink enough then BP drops (sometimes drinks less because he doesn't want to self cath as much). 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     esomeprazole (NEXIUM) 40 MG capsule Take 1 capsule by mouth twice daily 60 capsule 5   ibuprofen (ADVIL) 200 MG tablet Take 400-600 mg by mouth every 6 (six) hours as needed for  moderate pain.     metoprolol succinate (TOPROL-XL) 25 MG 24 hr tablet Take 25 mg by mouth daily.      nitroGLYCERIN (NITROSTAT) 0.4 MG SL tablet Place 0.4 mg under the tongue every 5 (five) minutes x 3 doses as needed for chest pain.      predniSONE (DELTASONE) 5 MG tablet Take 5 mg by mouth daily.  1   simvastatin (ZOCOR) 40 MG tablet Take 40 mg by mouth at bedtime.      sucralfate (CARAFATE) 1 g tablet Take 1 g by mouth daily as needed (acid reflux).     No current facility-administered medications for this encounter.    No Known Allergies    Social History   Socioeconomic History   Marital status: Married    Spouse name: Not on file   Number of children: 1   Years of education: Not on file   Highest education level: Not on file  Occupational History   Occupation: Retired    Comment: Retired Social research officer, government  Tobacco Use   Smoking status: Former    Types: Cigars   Smokeless tobacco: Never  Scientific laboratory technician Use: Never used  Substance and Sexual Activity   Alcohol use: Not Currently    Alcohol/week: 0.0 standard drinks of alcohol    Comment: occassional; denied 06/10/20   Drug use: No   Sexual activity: Yes    Partners:  Female    Comment: spouse  Other Topics Concern   Not on file  Social History Narrative   Not on file   Social Determinants of Health   Financial Resource Strain: Not on file  Food Insecurity: Not on file  Transportation Needs: Not on file  Physical Activity: Not on file  Stress: Not on file  Social Connections: Not on file  Intimate Partner Violence: Not on file      Family History  Problem Relation Age of Onset   Melanoma Maternal Grandmother    Congestive Heart Failure Mother    Deep vein thrombosis Mother    Bowel Disease Father        Volvulus   Heart attack Brother    Diabetes Brother     Vitals:   11/15/21 1424  BP: 104/60  Pulse: 75  SpO2: 95%  Weight: 86.5 kg (190 lb 12.8 oz)     Orthostatics done personally at last  visit  Sitting 140/68 Standing 118/66  PHYSICAL EXAM: General:  Well appearing. No resp difficulty HEENT: normal Neck: supple. no JVD. Carotids 2+ bilat; no bruits. No lymphadenopathy or thryomegaly appreciated. Cor: PMI nondisplaced. Regular rate & rhythm. No rubs, gallops or murmurs. Lungs: clear Abdomen: soft, nontender, nondistended. No hepatosplenomegaly. No bruits or masses. Good bowel sounds. Extremities: no cyanosis, clubbing, rash, edema Neuro: alert & orientedx3, cranial nerves grossly intact. moves all 4 extremities w/o difficulty. Affect pleasant   ECG: NSR 82 No ST-T wave abnormalities. Personally reviewed  ASSESSMENT & PLAN:   1. Orthostatic hypotension - Improved with liberalization of salt and fluid - Encouraged use of compression stockings - Stop Toprol - Avoid midodrine for now if possible given resting HTN. If needs meds, will consider Florinef  2. CAD - s/p CABG 2011 followed bp PCI in 2018 - no s/s angina - continue ASA/statin   3. 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 He has not been able to get [place] results.  - Will repeat echo here at next visit   Glori Bickers, MD  2:51 PM

## 2021-11-15 ENCOUNTER — Encounter (HOSPITAL_COMMUNITY): Payer: Self-pay | Admitting: Internal Medicine

## 2021-11-15 ENCOUNTER — Ambulatory Visit (HOSPITAL_COMMUNITY)
Admission: RE | Admit: 2021-11-15 | Discharge: 2021-11-15 | Disposition: A | Discharge status: Home / Self Care | Payer: Medicare Other | Source: Ambulatory Visit | Attending: Internal Medicine | Admitting: Internal Medicine

## 2021-11-15 VITALS — BP 104/60 | HR 75 | Wt 190.8 lb

## 2021-11-15 DIAGNOSIS — I10 Essential (primary) hypertension: Secondary | ICD-10-CM | POA: Insufficient documentation

## 2021-11-15 DIAGNOSIS — R0683 Snoring: Secondary | ICD-10-CM | POA: Insufficient documentation

## 2021-11-15 DIAGNOSIS — Z955 Presence of coronary angioplasty implant and graft: Secondary | ICD-10-CM | POA: Diagnosis not present

## 2021-11-15 DIAGNOSIS — Z8711 Personal history of peptic ulcer disease: Secondary | ICD-10-CM | POA: Diagnosis not present

## 2021-11-15 DIAGNOSIS — Z7902 Long term (current) use of antithrombotics/antiplatelets: Secondary | ICD-10-CM | POA: Diagnosis not present

## 2021-11-15 DIAGNOSIS — Z7982 Long term (current) use of aspirin: Secondary | ICD-10-CM | POA: Insufficient documentation

## 2021-11-15 DIAGNOSIS — Z923 Personal history of irradiation: Secondary | ICD-10-CM | POA: Diagnosis not present

## 2021-11-15 DIAGNOSIS — Z8571 Personal history of Hodgkin lymphoma: Secondary | ICD-10-CM | POA: Diagnosis not present

## 2021-11-15 DIAGNOSIS — Z951 Presence of aortocoronary bypass graft: Secondary | ICD-10-CM | POA: Insufficient documentation

## 2021-11-15 DIAGNOSIS — K219 Gastro-esophageal reflux disease without esophagitis: Secondary | ICD-10-CM | POA: Insufficient documentation

## 2021-11-15 DIAGNOSIS — Z9081 Acquired absence of spleen: Secondary | ICD-10-CM | POA: Insufficient documentation

## 2021-11-15 DIAGNOSIS — I951 Orthostatic hypotension: Secondary | ICD-10-CM | POA: Insufficient documentation

## 2021-11-15 DIAGNOSIS — I2510 Atherosclerotic heart disease of native coronary artery without angina pectoris: Secondary | ICD-10-CM | POA: Diagnosis not present

## 2021-11-15 NOTE — Patient Instructions (Signed)
Stop Metoprolol  Your physician recommends that you schedule a follow-up appointment in: 9 months (May 2024) with an echocardiogram, **PLEASE CALL OUR OFFICE IN MARCH TO SCHEDULE THIS APPOINTMENT  If you have any questions or concerns before your next appointment please send [place] a message through [place] or call our office at [phone].    TO LEAVE A MESSAGE FOR THE NURSE SELECT OPTION 2, PLEASE LEAVE A MESSAGE INCLUDING: YOUR NAME DATE OF BIRTH CALL BACK NUMBER REASON FOR CALL**this is important as we prioritize the call backs  YOU WILL RECEIVE A CALL BACK THE SAME DAY AS LONG AS YOU CALL BEFORE 4:00 PM  At the [facility], you and your health needs are our priority. As part of our continuing mission to provide you with exceptional heart care, we have created designated Provider Care Teams. These Care Teams include your primary Cardiologist (physician) and Advanced Practice Providers (APPs- Physician Assistants and Nurse Practitioners) who all work together to provide you with the care you need, when you need it.   You may see any of the following providers on your designated Care Team at your next follow up: Dr Glori Bickers Dr Haynes Kerns, NP Lyda Jester, [place] [facility] [place], [place] Audry Riles, PharmD   Please be sure to bring in all your medications bottles to every appointment.

## 2021-11-15 NOTE — Addendum Note (Signed)
Encounter addended by: Scarlette Calico, RN on: 11/15/2021 3:09 PM  Actions taken: Medication long-term status modified, Order list changed, Diagnosis association updated, Clinical Note Signed

## 2021-12-29 ENCOUNTER — Encounter: Payer: Self-pay | Admitting: Internal Medicine

## 2022-01-03 ENCOUNTER — Other Ambulatory Visit: Payer: Self-pay | Admitting: Gastroenterology

## 2022-03-21 ENCOUNTER — Ambulatory Visit: Payer: Medicare Other | Admitting: Gastroenterology

## 2022-03-22 ENCOUNTER — Ambulatory Visit (INDEPENDENT_AMBULATORY_CARE_PROVIDER_SITE_OTHER): Payer: Medicare Other | Admitting: Gastroenterology

## 2022-03-22 ENCOUNTER — Encounter: Payer: Self-pay | Admitting: Gastroenterology

## 2022-03-22 ENCOUNTER — Telehealth: Payer: Self-pay | Admitting: *Deleted

## 2022-03-22 ENCOUNTER — Telehealth: Payer: Self-pay | Admitting: Internal Medicine

## 2022-03-22 VITALS — BP 132/74 | HR 80 | Temp 98.0°F | Ht 71.0 in | Wt 193.2 lb

## 2022-03-22 DIAGNOSIS — I2510 Atherosclerotic heart disease of native coronary artery without angina pectoris: Secondary | ICD-10-CM | POA: Diagnosis not present

## 2022-03-22 DIAGNOSIS — R634 Abnormal weight loss: Secondary | ICD-10-CM | POA: Diagnosis not present

## 2022-03-22 DIAGNOSIS — R1013 Epigastric pain: Secondary | ICD-10-CM

## 2022-03-22 NOTE — Telephone Encounter (Signed)
Pt informed of CT date, time and instructions, will be at [facility]. Verbalized understanding

## 2022-03-22 NOTE — Telephone Encounter (Signed)
Pt returning call. [phone]

## 2022-03-22 NOTE — Patient Instructions (Signed)
Let's decrease Nexium to just once a day, 30 minutes before breakfast. If you have breakthrough reflux, you can take an over-the-counter Pepcid in evening.  I am arranging a special CT scan of the vessels in your abdomen. If this is negative, we will talk about different medication to help!  We will see you in 6-8 weeks!  It was a pleasure to see you today. I want to create trusting relationships with patients to provide genuine, compassionate, and quality care. I value your feedback. If you receive a survey regarding your visit,  I greatly appreciate you taking time to fill this out.   Annitta Needs, PhD, ANP-BC [facility]

## 2022-03-22 NOTE — Progress Notes (Signed)
Gastroenterology Office Note     Primary Care Physician:  Earney Mallet, MD  Primary Gastroenterologist: Dr. Gala Romney    Chief Complaint   Chief Complaint  Patient presents with   Abdominal Pain    Was noticing severe abd pain about an hr after eating but has started to happen first thing in the morning before eating.      History of Present Illness   Matthew Butler is a 76 y.o. male presenting today in follow-up with a history of chronic intermittent epigastric pain, early satiety, GERD.   CT abd/pelvis with/without contrast  April 2023: no acute findings.   EGD June 2023: normal esophagus, medium-sized hiatal hernia, normal duodenum.   Colonoscopy June 2023: four 4-6 mm polyps in sigmoid and splenic flexure, diverticulosis, non-bleeding internal hemorrhoids. Hyperplastic. 5 year surveillance if health permits.   In 2022, weighed in the low 200s. Weight fluctuated in 2023, staying near low 190s. States his highest weight was 214 down to 185.    Will have postprandial abdominal pain one hour after eating. Woke up the other morning with pain and hadn't eaten. Had severe pain after eating eggs, french toast. Last Wednesday went to [employer] and had chicken soup with salad. Pain persisted for hours thereafter. Feels like pain is more severe and longer. Getting a little gun shy about eating.   Nexium BID since 2021. He wonders if PPI may be causing this side effect. GERD controlled. Heating pad will at times improve symptoms. However, he feels that flares are worsening in intensity and frequency.     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  Numerous tubular adenomas removed.  Surveillance colonoscopy advised for November 2022.   COLONOSCOPY WITH PROPOFOL N/A 09/02/2021   Procedure: COLONOSCOPY WITH PROPOFOL;  Surgeon: [name], MD;  [facility];  Service: Endoscopy;  Laterality: N/A;  10:15am   CORONARY ANGIOPLASTY WITH STENT PLACEMENT  2018   CORONARY ARTERY BYPASS GRAFT  2011   ESOPHAGOGASTRODUODENOSCOPY N/A 03/06/2020   Procedure: ESOPHAGOGASTRODUODENOSCOPY (EGD);  Surgeon: [name], MD;  [facility];  Service: Endoscopy;  Laterality: N/A;  11:00am   ESOPHAGOGASTRODUODENOSCOPY (EGD) WITH PROPOFOL N/A 09/02/2021   Procedure: ESOPHAGOGASTRODUODENOSCOPY (EGD) WITH PROPOFOL;  Surgeon: [name], MD;  [facility];  Service: Endoscopy;  Laterality: N/A;   KNEE ARTHROPLASTY     MELANOMA EXCISION  2016   Left face   POLYPECTOMY  02/28/2018   Procedure: POLYPECTOMY;  Surgeon: [name], MD;  [facility];  Service: Endoscopy;;  (colon)   POLYPECTOMY  09/02/2021   Procedure: POLYPECTOMY INTESTINAL;  Surgeon: [name], MD;  [facility];  Service: Endoscopy;;  SPLENECTOMY  1980   For Hodgkin's lymphoma    Current Outpatient Medications  Medication Sig Dispense Refill   aspirin EC 81 MG tablet Take 81 mg by mouth daily.     esomeprazole (NEXIUM) 40 MG capsule Take 1 capsule by mouth twice daily 60 capsule 5   famotidine (PEPCID) 20 MG tablet Take 20 mg by mouth at bedtime.     ibuprofen (ADVIL) 200 MG tablet Take 400-600 mg by mouth every 6 (six) hours as needed for moderate pain.      nitroGLYCERIN (NITROSTAT) 0.4 MG SL tablet Place 0.4 mg under the tongue every 5 (five) minutes x 3 doses as needed for chest pain.      predniSONE (DELTASONE) 5 MG tablet Take 5 mg by mouth daily.  1   simvastatin (ZOCOR) 40 MG tablet Take 40 mg by mouth at bedtime.      tamsulosin (FLOMAX) 0.4 MG CAPS capsule Take 0.4 mg by mouth daily.     No current facility-administered medications for this visit.    Allergies as of 03/22/2022   (No Known Allergies)    Family History  Problem Relation Age of Onset   Melanoma Maternal Grandmother    Congestive Heart Failure Mother    Deep vein thrombosis Mother    Bowel Disease Father        Volvulus   Heart attack Brother    Diabetes Brother     Social History   Socioeconomic History   Marital status: Married    Spouse name: Not on file   Number of children: 1   Years of education: Not on file   Highest education level: Not on file  Occupational History   Occupation: Retired    Comment: Retired Social research officer, government  Tobacco Use   Smoking status: Former    Types: Cigars   Smokeless tobacco: Never  Scientific laboratory technician Use: Never used  Substance and Sexual Activity   Alcohol use: Not Currently    Alcohol/week: 0.0 standard drinks of alcohol    Comment: occassional; denied 06/10/20   Drug use: No   Sexual activity: Yes    Partners: Female    Comment: spouse  Other Topics Concern   Not on file  Social History Narrative   Not on file   Social Determinants of Health   Financial Resource Strain: Not on file  Food Insecurity: Not on file  Transportation Needs: Not on file  Physical Activity: Not on file  Stress: Not on file  Social Connections: Not on file  Intimate Partner Violence: Not on file     Review of Systems   Gen: see HPI CV: Denies chest pain, heart palpitations, peripheral edema, syncope.  Resp: Denies shortness of breath at rest or with exertion. Denies wheezing or cough.  GI: Denies dysphagia or odynophagia.  Denies jaundice, hematemesis, fecal incontinence. GU : Denies urinary burning, urinary frequency, urinary hesitancy MS: Denies joint pain, muscle weakness, cramps, or limitation of movement.  Derm: Denies rash, itching, dry skin Psych: Denies depression, anxiety, memory loss, and confusion Heme: Denies bruising, bleeding, and enlarged lymph nodes.   Physical Exam   BP 132/74 (BP Location: Right Arm, Patient Position: Sitting, Cuff Size: Large)   Pulse 80   Temp 98 F (36.7 C) (Oral)   Ht '5\' 11"'$  (1.803 m)   Wt 193 lb 3.2 oz (87.6 kg)   SpO2 96%   BMI 26.95 kg/m  General:   Alert and oriented. Pleasant and cooperative.  Well-nourished and well-developed.  Head:  Normocephalic and atraumatic. Eyes:  Without icterus Abdomen:  +BS, soft, non-tender and non-distended. No HSM noted. No guarding or rebound. No masses appreciated.  Rectal:  Deferred  Msk:  Symmetrical without gross deformities. Normal posture. Extremities:  Without edema. Neurologic:  Alert and  oriented x4;  grossly normal neurologically. Skin:  Intact without significant lesions or rashes. Psych:  Alert and cooperative. Normal mood and affect.   Assessment   Matthew Butler is a 76 y.o. male presenting today in follow-up with a history of chronic intermittent epigastric pain, early satiety, GERD.   Abdominal pain: thorough evaluation thus far. CT with/without contrast in April 2023. EGD normal June 2023. Colonoscopy up-to-date. Gallbladder absent. I note he has had weight loss and now becoming "gun shy" about eating. Location of pain not entirely typical for chronic mesenteric ischemia. However, will wrap up evaluation with CTA. If negative, consider TCA. We will also reduce Nexium to once daily. Doubt PPI side effect.      PLAN    CTA in next 2 weeks Decrease Nexium to daily, may add Pepcid if needed. Consider different PPI if CTA negative Consider TCA if CT negative 6-8 weeks    Annitta Needs, PhD,  [facility]

## 2022-03-22 NOTE — Telephone Encounter (Signed)
See previous TE

## 2022-03-22 NOTE — Telephone Encounter (Signed)
[facility]  CT angio is scheduled for 03/31/22 at 11:30 am, arrive at 11 am , nothing but liquids 4 hours prior to procedure.

## 2022-03-31 ENCOUNTER — Ambulatory Visit (HOSPITAL_COMMUNITY)
Admission: RE | Admit: 2022-03-31 | Discharge: 2022-03-31 | Disposition: A | Discharge status: Home / Self Care | Payer: Medicare Other | Source: Ambulatory Visit | Attending: Gastroenterology | Admitting: Gastroenterology

## 2022-03-31 DIAGNOSIS — R1013 Epigastric pain: Secondary | ICD-10-CM | POA: Insufficient documentation

## 2022-03-31 DIAGNOSIS — R634 Abnormal weight loss: Secondary | ICD-10-CM | POA: Insufficient documentation

## 2022-03-31 MED ORDER — IOHEXOL 350 MG/ML SOLN
100.0000 mL | Freq: Once | INTRAVENOUS | Status: AC | PRN
  Administered 2022-03-31: 100 mL via INTRAVENOUS

## 2022-04-05 ENCOUNTER — Other Ambulatory Visit: Payer: Self-pay | Admitting: Gastroenterology

## 2022-04-05 DIAGNOSIS — R1010 Upper abdominal pain, unspecified: Secondary | ICD-10-CM

## 2022-04-06 ENCOUNTER — Ambulatory Visit
Admission: RE | Admit: 2022-04-06 | Discharge: 2022-04-06 | Disposition: A | Discharge status: Home / Self Care | Payer: Medicare Other | Source: Ambulatory Visit | Attending: Gastroenterology | Admitting: Gastroenterology

## 2022-04-06 ENCOUNTER — Telehealth (HOSPITAL_COMMUNITY): Payer: Self-pay | Admitting: Radiology

## 2022-04-06 ENCOUNTER — Other Ambulatory Visit (HOSPITAL_COMMUNITY): Payer: Self-pay | Admitting: Interventional Radiology

## 2022-04-06 DIAGNOSIS — K551 Chronic vascular disorders of intestine: Secondary | ICD-10-CM

## 2022-04-06 DIAGNOSIS — R1010 Upper abdominal pain, unspecified: Secondary | ICD-10-CM

## 2022-04-06 HISTORY — PX: IR RADIOLOGIST EVAL & MGMT: IMG5224

## 2022-04-06 NOTE — Consult Note (Signed)
Chief Complaint: Patient was seen in virtual telephone consultation today for abdominal pain  Referring Physician(s): [name] W  History of Present Illness: Matthew Butler is a 77 y.o. male with history of abdominal pain. He follows in [place] with [name] with whom he has undergone extensive work up for abdominal pain including EGD, colonoscopy, PPI treatment, and most recently a CTA abdomen pelvis which showed findings concerning for chronic mesenteric ischemia.  His pain is episodic and primarily post-prandial.  It is not associated to certain foods.  The pain is extreme when it occurs, nearly prompting visits to the ED.  He uses a heating pad, advil/alkaseltzer, and it takes 2-3 hrs to resolve.  Mostly moderate and tolerable pain after eating, but severe attacks have occurred 6 times in the past month, starting several months ago.  On flomax for BPH - appt scheduled for possible holep.  Bladder ca, diagnosed 2021, BCG treatment.  Hodgkins disease treated in 74s.  CAD s/p 4 CABG 2011, 2018 PCI, on ASA. GERD, on nexium.  HTN hx, now off antihypertensives for dizziness, some hypotension.  Hyperlipidemia, well controlled on simvastatin.  No smoking, occasional cigar.  No EtOH.      Past Medical History:  Diagnosis Date   BPH (benign prostatic hyperplasia)    Coronary artery disease 2011   GERD (gastroesophageal reflux disease)    History of pulmonary embolus (PE)    After coronary artery bypass graft   Hodgkin disease ([place])    1980s, status post splenectomy and radiation   HTN (hypertension)    Hyperlipidemia     Past Surgical History:  Procedure Laterality Date   APPENDECTOMY     BIOPSY  03/06/2020   Procedure: BIOPSY;  Surgeon: [name], MD;  [facility];  Service: Endoscopy;;   CHOLECYSTECTOMY N/A 04/18/2014   Procedure: LAPAROSCOPIC CHOLECYSTECTOMY;  Surgeon: Jamesetta So, MD;  Location: [facility];  Service: General;  Laterality: N/A;   COLONOSCOPY  N/A 02/28/2018   Rourk: Diverticulosis.  Numerous tubular adenomas removed.  Surveillance colonoscopy advised for November 2022.   COLONOSCOPY WITH PROPOFOL N/A 09/02/2021   Procedure: COLONOSCOPY WITH PROPOFOL;  Surgeon: [name], MD;  [facility];  Service: Endoscopy;  Laterality: N/A;  10:15am   CORONARY ANGIOPLASTY WITH STENT PLACEMENT  2018   CORONARY ARTERY BYPASS GRAFT  2011   ESOPHAGOGASTRODUODENOSCOPY N/A 03/06/2020   Procedure: ESOPHAGOGASTRODUODENOSCOPY (EGD);  Surgeon: [name], MD;  [facility];  Service: Endoscopy;  Laterality: N/A;  11:00am   ESOPHAGOGASTRODUODENOSCOPY (EGD) WITH PROPOFOL N/A 09/02/2021   Procedure: ESOPHAGOGASTRODUODENOSCOPY (EGD) WITH PROPOFOL;  Surgeon: [name], MD;  [facility];  Service: Endoscopy;  Laterality: N/A;   KNEE ARTHROPLASTY     MELANOMA EXCISION  2016   Left face   POLYPECTOMY  02/28/2018   Procedure: POLYPECTOMY;  Surgeon: [name], MD;  [facility];  Service: Endoscopy;;  (colon)   POLYPECTOMY  09/02/2021   Procedure: POLYPECTOMY INTESTINAL;  Surgeon: [name], MD;  [facility];  Service: Endoscopy;;   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  Medications: Prior to Admission medications   Medication Sig Start Date End Date Taking? Authorizing Provider  aspirin EC 81 MG tablet Take 81 mg by mouth daily.    [provider]  esomeprazole (NEXIUM) 40 MG capsule Take 1 capsule by mouth twice daily 01/04/22  Mahala Menghini, PA-C  famotidine (PEPCID) 20 MG tablet Take 20 mg by mouth at bedtime. 01/03/22   [provider]  ibuprofen (ADVIL) 200 MG tablet Take 400-600 mg by mouth every 6 (six) hours as needed for moderate pain.    [provider]  nitroGLYCERIN (NITROSTAT) 0.4 MG SL tablet Place 0.4 mg under the tongue every 5 (five) minutes x 3 doses as needed for chest pain.      [provider]  predniSONE (DELTASONE) 5 MG tablet Take 5 mg by mouth daily. 01/29/18   [provider]  simvastatin (ZOCOR) 40 MG tablet Take 40 mg by mouth at bedtime.  02/24/14   [provider]  tamsulosin (FLOMAX) 0.4 MG CAPS capsule Take 0.4 mg by mouth daily. 01/30/22   [provider]     Family History  Problem Relation Age of Onset   Melanoma Maternal Grandmother    Congestive Heart Failure Mother    Deep vein thrombosis Mother    Bowel Disease Father        Volvulus   Heart attack Brother    Diabetes Brother     Social History   Socioeconomic History   Marital status: Married    Spouse name: Not on file   Number of children: 1   Years of education: Not on file   Highest education level: Not on file  Occupational History   Occupation: Retired    Comment: Retired Social research officer, government  Tobacco Use   Smoking status: Former    Types: Cigars   Smokeless tobacco: Never  Scientific laboratory technician Use: Never used  Substance and Sexual Activity   Alcohol use: Not Currently    Alcohol/week: 0.0 standard drinks of alcohol    Comment: occassional; denied 06/10/20   Drug use: No   Sexual activity: Yes    Partners: Female    Comment: spouse  Other Topics Concern   Not on file  Social History Narrative   Not on file   Social Determinants of Health   Financial Resource Strain: Not on file  Food Insecurity: Not on file  Transportation Needs: Not on file  Physical Activity: Not on file  Stress: Not on file  Social Connections: Not on file    Review of Systems: A 12 point ROS discussed and pertinent positives are indicated in the HPI above.  All other systems are negative.   Vital Signs: There were no vitals taken for this visit.  No physical examination was performed in lieu of virtual telephone clinic visit.  Imaging: CTA [facility] 03/31/22  Celiac - high grade focal ostial stenosis due to calcified atherosclerotic plaque   SMA - 3 cm  proximal occlusion vs. near complete occlusion due to complex atherosclerotic plaque   IMA - moderate to severe ostial stenosis due to atherosclerotic plaque   [place]     Labs:  CBC: Recent Labs    07/07/21 1344  WBC 12.8*  HGB 14.9  HCT 43.7  PLT 372    COAGS: No results for input(s): "INR", "APTT" in the last 8760 hours.  BMP: Recent Labs    07/07/21 1344  NA 138  K 4.3  CL 101  CO2 21  GLUCOSE 97  BUN 34*  CALCIUM 9.4  CREATININE 1.04    LIVER FUNCTION TESTS: Recent Labs    07/07/21 1344  BILITOT 0.7  AST 22  ALT 20  ALKPHOS 73  PROT 7.1  ALBUMIN 4.2  TUMOR MARKERS: No results for input(s): "AFPTM", "CEA", "CA199", "CHROMGRNA" in the last 8760 hours.  Assessment and Plan: 77 year old male with clinical and imaging findings most compatible with chronic mesenteric ischemia.  We discussed the pathophysiology and natural history of chronic mesenteric ischemia, treatment options including medical, surgical, and endovascular.  We discussed the periprocedural expectations of endovascular repair including starting dual antiplatelet therapy.    I believe he would be best served by recanalization of the SMA with covered stent placement, and possible additional stent placement in the celiac.  He is amenable to this plan.    Plan for mesenteric angiogram and possible SMA, celiac stent placements at [place] for possible overnight observation.  Continue aspirin prior to procedure (do not hold).   Electronically Signed: Suzette Battiest, MD 04/06/2022, 8:30 AM   I spent a total of  40 Minutes  in virtual telephone clinical consultation, greater than 50% of which was counseling/coordinating care for chronic mesenteric ischemia.

## 2022-04-06 NOTE — Telephone Encounter (Signed)
error 

## 2022-04-06 NOTE — Telephone Encounter (Signed)
Called pt to offer him 04/14/22 for his mesenteric angio with Dr. [name]. Pt states that he has an appt at [facility] that day and cannot miss that appt. I will call him back with other available dates once I have some. He agrees with this plan of care. JM

## 2022-04-14 ENCOUNTER — Other Ambulatory Visit: Payer: Self-pay | Admitting: Radiology

## 2022-04-14 DIAGNOSIS — K551 Chronic vascular disorders of intestine: Secondary | ICD-10-CM

## 2022-04-18 ENCOUNTER — Other Ambulatory Visit: Payer: Self-pay | Admitting: Student

## 2022-04-19 ENCOUNTER — Other Ambulatory Visit: Payer: Self-pay

## 2022-04-19 ENCOUNTER — Other Ambulatory Visit: Payer: Self-pay | Admitting: Radiology

## 2022-04-19 ENCOUNTER — Other Ambulatory Visit (HOSPITAL_COMMUNITY): Payer: Self-pay | Admitting: Interventional Radiology

## 2022-04-19 ENCOUNTER — Encounter (HOSPITAL_COMMUNITY): Payer: Self-pay

## 2022-04-19 ENCOUNTER — Ambulatory Visit (HOSPITAL_COMMUNITY)
Admission: RE | Admit: 2022-04-19 | Discharge: 2022-04-19 | Disposition: A | Discharge status: Home / Self Care | Payer: Medicare Other | Source: Ambulatory Visit | Attending: Interventional Radiology | Admitting: Interventional Radiology

## 2022-04-19 VITALS — BP 120/54 | HR 75 | Temp 99.2°F | Resp 16 | Ht 71.0 in | Wt 185.0 lb

## 2022-04-19 DIAGNOSIS — Z79899 Other long term (current) drug therapy: Secondary | ICD-10-CM | POA: Insufficient documentation

## 2022-04-19 DIAGNOSIS — Z87891 Personal history of nicotine dependence: Secondary | ICD-10-CM | POA: Insufficient documentation

## 2022-04-19 DIAGNOSIS — Z8379 Family history of other diseases of the digestive system: Secondary | ICD-10-CM | POA: Diagnosis not present

## 2022-04-19 DIAGNOSIS — K219 Gastro-esophageal reflux disease without esophagitis: Secondary | ICD-10-CM | POA: Insufficient documentation

## 2022-04-19 DIAGNOSIS — K551 Chronic vascular disorders of intestine: Secondary | ICD-10-CM

## 2022-04-19 DIAGNOSIS — E785 Hyperlipidemia, unspecified: Secondary | ICD-10-CM | POA: Insufficient documentation

## 2022-04-19 DIAGNOSIS — Z951 Presence of aortocoronary bypass graft: Secondary | ICD-10-CM | POA: Insufficient documentation

## 2022-04-19 DIAGNOSIS — I10 Essential (primary) hypertension: Secondary | ICD-10-CM | POA: Insufficient documentation

## 2022-04-19 DIAGNOSIS — I2510 Atherosclerotic heart disease of native coronary artery without angina pectoris: Secondary | ICD-10-CM | POA: Insufficient documentation

## 2022-04-19 DIAGNOSIS — Z7982 Long term (current) use of aspirin: Secondary | ICD-10-CM | POA: Diagnosis not present

## 2022-04-19 DIAGNOSIS — K559 Vascular disorder of intestine, unspecified: Secondary | ICD-10-CM

## 2022-04-19 HISTORY — PX: IR TRANSCATH PLC STENT 1ST ART NOT LE CV CAR VERT CAR: IMG5443

## 2022-04-19 HISTORY — PX: IR IVUS EACH ADDITIONAL NON CORONARY VESSEL: IMG6086

## 2022-04-19 HISTORY — PX: IR AORTAGRAM ABDOMINAL SERIALOGRAM: IMG636

## 2022-04-19 HISTORY — PX: IR US GUIDE VASC ACCESS RIGHT: IMG2390

## 2022-04-19 HISTORY — PX: IR ANGIOGRAM VISCERAL SELECTIVE: IMG657

## 2022-04-19 LAB — CBC
HCT: 42.4 % (ref 39.0–52.0)
Hemoglobin: 14.3 g/dL (ref 13.0–17.0)
MCH: 32.4 pg (ref 26.0–34.0)
MCHC: 33.7 g/dL (ref 30.0–36.0)
MCV: 96.1 fL (ref 80.0–100.0)
Platelets: 396 10*3/uL (ref 150–400)
RBC: 4.41 MIL/uL (ref 4.22–5.81)
RDW: 13.9 % (ref 11.5–15.5)
WBC: 11.2 10*3/uL — ABNORMAL HIGH (ref 4.0–10.5)
nRBC: 0 % (ref 0.0–0.2)

## 2022-04-19 LAB — POCT ACTIVATED CLOTTING TIME: Activated Clotting Time: 152 seconds

## 2022-04-19 LAB — BASIC METABOLIC PANEL
Anion gap: 7 (ref 5–15)
BUN: 14 mg/dL (ref 8–23)
CO2: 29 mmol/L (ref 22–32)
Calcium: 9.1 mg/dL (ref 8.9–10.3)
Chloride: 103 mmol/L (ref 98–111)
Creatinine, Ser: 1.11 mg/dL (ref 0.61–1.24)
GFR, Estimated: >60 mL/min (ref >60)
Glucose, Bld: 104 mg/dL — ABNORMAL HIGH (ref 70–99)
Potassium: 3.6 mmol/L (ref 3.5–5.1)
Sodium: 139 mmol/L (ref 135–145)

## 2022-04-19 LAB — PROTIME-INR
INR: 1 (ref 0.8–1.2)
Prothrombin Time: 13 seconds (ref 11.4–15.2)

## 2022-04-19 MED ORDER — LIDOCAINE HCL 1 % IJ SOLN
INTRAMUSCULAR | Status: AC
  Filled 2022-04-19: qty 20

## 2022-04-19 MED ORDER — IOHEXOL 300 MG/ML  SOLN: 100.0000 mL | Freq: Once | INTRAMUSCULAR | Status: DC | PRN

## 2022-04-19 MED ORDER — CEFAZOLIN SODIUM-DEXTROSE 2-4 GM/100ML-% IV SOLN: 2.0000 g | INTRAVENOUS | Status: DC

## 2022-04-19 MED ORDER — CEFAZOLIN SODIUM-DEXTROSE 2-4 GM/100ML-% IV SOLN
INTRAVENOUS | Status: AC
  Filled 2022-04-19: qty 100

## 2022-04-19 MED ORDER — HEPARIN SODIUM (PORCINE) 1000 UNIT/ML IJ SOLN
INTRAMUSCULAR | Status: AC
  Filled 2022-04-19: qty 10

## 2022-04-19 MED ORDER — FENTANYL CITRATE (PF) 100 MCG/2ML IJ SOLN
INTRAMUSCULAR | Status: AC | PRN
  Administered 2022-04-19: 50 ug via INTRAVENOUS
  Administered 2022-04-19: 25 ug via INTRAVENOUS
  Administered 2022-04-19: 50 ug via INTRAVENOUS
  Administered 2022-04-19: 25 ug via INTRAVENOUS

## 2022-04-19 MED ORDER — MIDAZOLAM HCL 2 MG/2ML IJ SOLN
INTRAMUSCULAR | Status: AC
  Filled 2022-04-19: qty 6

## 2022-04-19 MED ORDER — SODIUM CHLORIDE 0.9 % IV SOLN: INTRAVENOUS | Status: DC

## 2022-04-19 MED ORDER — FENTANYL CITRATE (PF) 100 MCG/2ML IJ SOLN
INTRAMUSCULAR | Status: AC
  Filled 2022-04-19: qty 4

## 2022-04-19 MED ORDER — MIDAZOLAM HCL 2 MG/2ML IJ SOLN
INTRAMUSCULAR | Status: AC | PRN
  Administered 2022-04-19: 1 mg via INTRAVENOUS
  Administered 2022-04-19 (×2): .5 mg via INTRAVENOUS
  Administered 2022-04-19 (×2): 1 mg via INTRAVENOUS

## 2022-04-19 MED ORDER — CLOPIDOGREL BISULFATE 75 MG PO TABS
300.0000 mg | ORAL_TABLET | Freq: Once | ORAL | Status: AC
  Administered 2022-04-19: 300 mg via ORAL
  Filled 2022-04-19: qty 4

## 2022-04-19 MED ORDER — CLOPIDOGREL BISULFATE 75 MG PO TABS: 75.0000 mg | ORAL_TABLET | Freq: Every day | ORAL | 4 refills | Status: DC

## 2022-04-19 MED ORDER — HEPARIN SODIUM (PORCINE) 1000 UNIT/ML IJ SOLN
INTRAMUSCULAR | Status: AC | PRN
  Administered 2022-04-19: 3000 [IU] via INTRAVENOUS

## 2022-04-19 MED ORDER — CEFAZOLIN SODIUM-DEXTROSE 2-4 GM/100ML-% IV SOLN
INTRAVENOUS | Status: AC | PRN
  Administered 2022-04-19: 2 g via INTRAVENOUS

## 2022-04-19 NOTE — H&P (Signed)
Chief Complaint: Patient was seen in consultation today for chronic mesenteric ischemia/ mesenteric arteriogram with intervention at the request of [name] NP   Supervising Physician: [name]  Patient Status: [facility] - Out-pt  History of Present Illness: Matthew Butler is a 77 y.o. male   BPH ;  Bladder ca, diagnosed 2021, BCG treatment. 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 CAD s/p 4 CABG 2011, 2018 PCI, on ASA. GERD, on nexium. HTN hx, now off antihypertensives for dizziness, some hypotension. Hyperlipidemia, well controlled on simvastatin.   Pt has been seen in consultation with Dr [name] 04/06/22 for mesenteric ischemia Abd pain and followed with [facility] CTA abdomen pelvis which showed findings concerning for chronic mesenteric ischemia.   Dr [name] note 04/06/22:  Chronic mesenteric ischemia.  We discussed the pathophysiology and natural history of chronic mesenteric ischemia, treatment options including medical, surgical, and endovascular.  We discussed the periprocedural expectations of endovascular repair including starting dual antiplatelet therapy.   I believe he would be best served by recanalization of the SMA with covered stent placement, and possible additional stent placement in the celiac.  He is amenable to this plan.   Plan for mesenteric angiogram and possible SMA, celiac stent placements at [facility] for possible overnight observation.  Continue aspirin prior to procedure (do not hold).   Scheduled now for same   Past Medical History:  Diagnosis Date   BPH (benign prostatic hyperplasia)    Coronary artery disease 2011   GERD (gastroesophageal reflux disease)    History of pulmonary embolus (PE)    After coronary artery bypass graft   Hodgkin disease ([place])    1980s, status post splenectomy and radiation   HTN (hypertension)    Hyperlipidemia     Past Surgical History:  Procedure Laterality Date   APPENDECTOMY     BIOPSY   03/06/2020   Procedure: BIOPSY;  Surgeon: [name], MD;  Location: [facility] END[facility];  Service: Endoscopy;;   CHOLECYSTECTOMY N/A 04/18/2014   Procedure: LAPAROSCOPIC CHOLECYSTECTOMY;  Surgeon: Jamesetta So, MD;  Location: [facility] ORS;  Service: General;  Laterality: N/A;   COLONOSCOPY N/A 02/28/2018   [place]: Diverticulosis.  Numerous tubular adenomas removed.  Surveillance colonoscopy advised for November 2022.   COLONOSCOPY WITH PROPOFOL N/A 09/02/2021   Procedure: COLONOSCOPY WITH PROPOFOL;  Surgeon: [name], MD;  Location: [facility] END[facility];  Service: Endoscopy;  Laterality: N/A;  10:15am   CORONARY ANGIOPLASTY WITH STENT PLACEMENT  2018   CORONARY ARTERY BYPASS GRAFT  2011   ESOPHAGOGASTRODUODENOSCOPY N/A 03/06/2020   Procedure: ESOPHAGOGASTRODUODENOSCOPY (EGD);  Surgeon: [name], MD;  [facility];  Service: Endoscopy;  Laterality: N/A;  11:00am   ESOPHAGOGASTRODUODENOSCOPY (EGD) WITH PROPOFOL N/A 09/02/2021   Procedure: ESOPHAGOGASTRODUODENOSCOPY (EGD) WITH PROPOFOL;  Surgeon: [name], MD;  [facility];  Service: Endoscopy;  Laterality: N/A;   IR RADIOLOGIST EVAL & MGMT  04/06/2022   KNEE ARTHROPLASTY     MELANOMA EXCISION  2016   Left face   POLYPECTOMY  02/28/2018   Procedure: POLYPECTOMY;  Surgeon: [name], MD;  [facility];  Service: Endoscopy;;  (colon)   POLYPECTOMY  09/02/2021   Procedure: POLYPECTOMY INTESTINAL;  Surgeon: [name], MD;  Location: [facility];  Service: Endoscopy;;   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  Medications: Prior to Admission medications   Medication  Sig Start Date End Date Taking? Authorizing Provider  aspirin EC 81 MG tablet Take 81 mg by mouth daily.   Yes [provider]  esomeprazole (NEXIUM) 40 MG capsule Take 1 capsule by mouth twice daily 01/04/22  Yes Mahala Menghini, PA-C  famotidine (PEPCID) 20 MG tablet Take 20  mg by mouth at bedtime. 01/03/22  Yes [provider]  ibuprofen (ADVIL) 200 MG tablet Take 400-600 mg by mouth every 6 (six) hours as needed for moderate pain.   Yes [provider]  simvastatin (ZOCOR) 40 MG tablet Take 40 mg by mouth at bedtime.  02/24/14  Yes [provider]  tamsulosin (FLOMAX) 0.4 MG CAPS capsule Take 0.4 mg by mouth daily. 01/30/22  Yes [provider]  nitroGLYCERIN (NITROSTAT) 0.4 MG SL tablet Place 0.4 mg under the tongue every 5 (five) minutes x 3 doses as needed for chest pain.     [provider]  predniSONE (DELTASONE) 5 MG tablet Take 5 mg by mouth daily. 01/29/18   [provider]     Family History  Problem Relation Age of Onset   Melanoma Maternal Grandmother    Congestive Heart Failure Mother    Deep vein thrombosis Mother    Bowel Disease Father        Volvulus   Heart attack Brother    Diabetes Brother     Social History   Socioeconomic History   Marital status: Married    Spouse name: Not on file   Number of children: 1   Years of education: Not on file   Highest education level: Not on file  Occupational History   Occupation: Retired    Comment: Retired Social research officer, government  Tobacco Use   Smoking status: Former    Types: Cigars   Smokeless tobacco: Never  Scientific laboratory technician Use: Never used  Substance and Sexual Activity   Alcohol use: Not Currently    Alcohol/week: 0.0 standard drinks of alcohol    Comment: occassional; denied 06/10/20   Drug use: No   Sexual activity: Yes    Partners: Female    Comment: spouse  Other Topics Concern   Not on file  Social History Narrative   Not on file   Social Determinants of Health   Financial Resource Strain: Not on file  Food Insecurity: Not on file  Transportation Needs: Not on file  Physical Activity: Not on file  Stress: Not on file  Social Connections: Not on file    Review of Systems: A 12 point ROS discussed and pertinent positives are  indicated in the HPI above.  All other systems are negative.  Review of Systems  Constitutional:  Positive for appetite change and unexpected weight change. Negative for activity change, fatigue and fever.  Respiratory:  Negative for cough and shortness of breath.   Cardiovascular:  Negative for chest pain.  Gastrointestinal:  Positive for abdominal pain.  Neurological:  Negative for weakness.  Psychiatric/Behavioral:  Negative for behavioral problems and confusion.     Vital Signs: BP 136/71   Pulse 80   Temp 99.2 F (37.3 C) (Oral)   Resp 16   Ht '5\' 11"'$  (1.803 m)   Wt 185 lb (83.9 kg)   SpO2 97%   BMI 25.80 kg/m     Physical Exam Vitals reviewed.  HENT:     Mouth/Throat:     Mouth: Mucous membranes are moist.  Cardiovascular:     Rate and Rhythm: Normal  rate and regular rhythm.     Heart sounds: Murmur heard.  Pulmonary:     Effort: Pulmonary effort is normal.     Breath sounds: Normal breath sounds. No wheezing.  Abdominal:     Palpations: Abdomen is soft.  Musculoskeletal:        General: Normal range of motion.  Skin:    General: Skin is warm.  Neurological:     Mental Status: He is alert and oriented to person, place, and time.  Psychiatric:        Behavior: Behavior normal.     Imaging: IR Radiologist Eval & Mgmt  Result Date: 04/06/2022 EXAM: NEW PATIENT OFFICE VISIT CHIEF COMPLAINT: See Epic note. HISTORY OF PRESENT ILLNESS: See Epic note. REVIEW OF SYSTEMS: See Epic note. PHYSICAL EXAMINATION: See Epic note. ASSESSMENT AND PLAN: See Epic note. [name], MD [facility] Radiology Electronically Signed   By: [name] M.D.   On: 04/06/2022 10:57   CT Angio Abd/Pel w/ and/or w/o  Result Date: 04/02/2022 CLINICAL DATA:  Postprandial abdominal pain, weight loss. Suspect chronic mesenteric ischemia. EXAM: CTA ABDOMEN AND PELVIS WITHOUT AND WITH CONTRAST TECHNIQUE: Multidetector CT imaging of the  abdomen and pelvis was performed using the standard protocol during bolus administration of intravenous contrast. Multiplanar reconstructed images and MIPs were obtained and reviewed to evaluate the vascular anatomy. RADIATION DOSE REDUCTION: This exam was performed according to the departmental dose-optimization program which includes automated exposure control, adjustment of the mA and/or kV according to patient size and/or use of iterative reconstruction technique. CONTRAST:  168m OMNIPAQUE IOHEXOL 350 MG/ML SOLN COMPARISON:  CT abdomen/pelvis 07/13/2021 FINDINGS: VASCULAR Aorta: Calcified atherosclerotic plaque throughout the abdominal aorta with some areas of fibrofatty plaque. No evidence of aneurysm or dissection. Celiac: Heavily calcified atherosclerotic plaque at the origin of the celiac artery results in high-grade stenosis. There is mild poststenotic dilation. No dissection or aneurysm. SMA: The proximal SMA is completely occluded for several cm before the vessel reconstitutes via collateral flow through the pancreaticoduodenal and middle colic to gastroepiploic arcades. Renals: Solitary renal arteries bilaterally. Mild calcified atherosclerotic plaque. No significant stenosis, dissection or aneurysm. No changes of fibromuscular dysplasia. IMA: Moderate stenosis at the origin of the IMA secondary to mixed calcified and fibrofatty atherosclerotic plaque. The vessel is hypertrophic and is likely providing significant collateral flow to the SMA distribution via an enlarged Arc of [place]. Inflow: Patent without evidence of aneurysm, dissection, vasculitis or significant stenosis. Proximal Outflow: Bilateral common femoral and visualized portions of the superficial and profunda femoral arteries are patent without evidence of aneurysm, dissection, vasculitis or significant stenosis. Veins: No focal venous abnormality. Review of the MIP images confirms the above findings. NON-VASCULAR Lower chest: Small hiatal  hernia. Calcifications noted throughout the coronary arteries and along the periphery of the aortic valve. The heart is normal in size. No pericardial effusion. Mild lower lobe bronchial wall thickening. Tiny calcified granuloma in the periphery of the left lower lobe is considered benign and warrants no further follow-up. Hepatobiliary: No focal liver abnormality is seen. Status post cholecystectomy. No biliary dilatation. Pancreas: Unremarkable. No pancreatic ductal dilatation or surrounding inflammatory changes. Spleen: Surgical changes of prior splenectomy. Similar appearance of multiple splenules in the left upper quadrant. Adrenals/Urinary Tract: Normal adrenal glands. No hydronephrosis, nephrolithiasis or enhancing renal mass. Small low-attenuation renal lesions bilaterally are too small to characterize but almost certainly benign cysts. No imaging follow-up recommended. The ureters are unremarkable. However, the bladder is  diffusely thick walled. There is a bladder diverticulum arising off the left posterior aspect of the bladder. Findings suggest chronic bladder outlet obstruction likely secondary to benign prostatic hyperplasia. Stomach/Bowel: Colonic diverticular disease without CT evidence of active inflammation. No focal bowel wall thickening or evidence of obstruction. Periampullary duodenal diverticulum noted incidentally. Lymphatic: No suspicious lymphadenopathy. Reproductive: Marked prostatomegaly with indentation of the bladder base. Other: No abdominal wall hernia or abnormality. No abdominopelvic ascites. Musculoskeletal: Significant straightening of the normal lumbar lordosis with advanced multilevel degenerative disc disease resulting in focal levoconvex scoliosis centered at L3. No acute fracture or malalignment. No lytic or blastic osseous lesion. IMPRESSION: VASCULAR 1. Findings are positive for significant visceral artery occlusive disease and are consistent with the provided clinical  history of chronic mesenteric ischemia. 2. 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 Collateral flow to the SMA distribution occurs from both the celiac and IMA territories. 3. 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 4. Moderate stenosis of the origin of the IMA. 5.  Aortic Atherosclerosis (ICD10-I70.0). NON-VASCULAR 1. Prostatomegaly with significant protrusion of prostatic tissue into the bladder base likely reflecting median lobe hypertrophy in the setting of benign prostatic hyperplasia. There is also evidence of chronic bladder outlet obstruction with diffuse thickening and trabeculation of the bladder wall and a left posterolateral bladder diverticulum. Recommend referral to urology for further evaluation and to exclude underlying prostatic or urothelial malignancy if not already performed. 2. Additional ancillary findings as above without significant interval change. Signed, Criselda Peaches, MD, [place] [facility] Radiology Electronically Signed   By: Jacqulynn Cadet M.D.   On: 04/02/2022 09:22    Labs:  CBC: Recent Labs    07/07/21 1344 04/19/22 0727  WBC 12.8* 11.2*  HGB 14.9 14.3  HCT 43.7 42.4  PLT 372 396    COAGS: Recent Labs    04/19/22 0727  INR 1.0    BMP: Recent Labs    07/07/21 1344 04/19/22 0727  NA 138 139  K 4.3 3.6  CL 101 103  CO2 21 29  GLUCOSE 97 104*  BUN 34* 14  CALCIUM 9.4 9.1  CREATININE 1.04 1.11  GFRNONAA  --  >60    LIVER FUNCTION TESTS: Recent Labs    07/07/21 1344  BILITOT 0.7  AST 22  ALT 20  ALKPHOS 73  PROT 7.1  ALBUMIN 4.2    TUMOR MARKERS: No results for input(s): "AFPTM", "CEA", "CA199", "CHROMGRNA" in the last 8760 hours.  Assessment and Plan:  Chronic mesenteric ischemia Scheduled for arteriogram with probable intervention Risks and benefits of Mesenteric arteriogram with  probable intervention were discussed with the patient including, but not limited to bleeding, infection, vascular injury or contrast induced renal failure.  This interventional procedure involves the use of X-rays and because of the nature of the planned procedure, it is possible that we will have prolonged use of X-ray fluoroscopy.  Potential radiation risks to you include (but are not limited to) the following: - A slightly elevated risk for cancer  several years later in life. This risk is typically less than 0.5% percent. This risk is low in comparison to the normal incidence of human cancer, which is 33% for women and 50% for men according to the [place]. - Radiation induced injury can include skin redness, resembling a rash, tissue breakdown / ulcers and hair loss (which can be temporary or permanent).  The likelihood of either of these occurring depends on the difficulty of the procedure and whether you are sensitive to radiation due to previous procedures, disease, or genetic conditions.   IF your procedure requires a prolonged use of radiation, you will be notified and given written instructions for further action.  It is your responsibility to monitor the irradiated area for the 2 weeks following the procedure and to notify your physician if you are concerned that you have suffered a radiation induced injury.    All of the patient's questions were answered, patient is agreeable to proceed.  Consent signed and in chart   Thank you for this interesting consult.  I greatly enjoyed meeting Jackson Coffield and look forward to participating in their care.  A copy of this report was sent to the requesting provider on this date.  Electronically Signed: Lavonia Drafts, PA-C 04/19/2022, 8:27 AM   I spent a total of    25 Minutes in face to face in clinical consultation, greater than 50% of which was counseling/coordinating care for mesenteric arteriogram with probable  intervention

## 2022-04-19 NOTE — Procedures (Signed)
Interventional Radiology Procedure Note  Procedure:  1) Abdominal aortogram 2) Selective catheterization and angiography of the celiac trunk 3) Celiac stent placement  Findings: Please refer to procedural dictation for full description. Long segment occlusion of proximal SMA, unable to be recanalized.  Severe ostial stenosis of celiac trunk, recanalized and deployed 7 mm x 19 mm VBX, with post balloon molding to 10 mm at the aortic aspect.    R CFA 8 Fr Angioseal closure.  Complications: None immediate  Estimated Blood Loss: < 5 mL  Recommendations: Strict 4 hour bedrest (2 hours flat, 2 hours head of bed up to 30 degrees). Hopeful for discharge home later today after bedrest complete.   [name], MD Pager: [phone]

## 2022-04-19 NOTE — Progress Notes (Signed)
Patient with a history of chronic mesenteric ischemia s/p stent placement in the proximal celiac trunk by Dr. [name].   Patient evaluated in short stay at the end of his bedrest period. He has ambulated in the  hall, voided and has had something to [place]. He denies any pain or nausea. Right groin vascular site is clean, soft, dry and non-tender.   He will follow up with Dr. [name] in approximately one month for a post-procedure evaluation including mesenteric duplex. A scheduler from our office will call the patient with a date/time of his appointment.   Patient is stable for discharge home. Patient and his wife know they can call IR with any questions or concerns.   Soyla Dryer, [place] [phone] 04/19/2022, 3:59 PM

## 2022-04-19 NOTE — Discharge Instructions (Addendum)
START TAKING PLAVIX TOMORROW 04/20/2022 [facility]     Femoral Site Care This sheet gives you information about how to care for yourself after your procedure. Your health care provider may also give you more specific instructions. If you have problems or questions, contact your health care provider. What can I expect after the procedure?  After the procedure, it is common to have: Bruising that usually fades within 1-2 weeks. Tenderness at the site. Follow these instructions at home: Wound care Follow instructions from your health care provider about how to take care of your insertion site. Make sure you: Wash your hands with soap and water before you change your bandage (dressing). If soap and water are not available, use hand sanitizer. Remove your dressing as told by your health care provider. In 24 hours Do not take baths, swim, or use a hot tub until your health care provider approves. You may shower 24-48 hours after the procedure or as told by your health care provider. Gently wash the site with plain soap and water. Pat the area dry with a clean towel. Do not rub the site. This may cause bleeding. Do not apply powder or lotion to the site. Keep the site clean and dry. Check your femoral site every day for signs of infection. Check for: Redness, swelling, or pain. Fluid or blood. Warmth. Pus or a bad smell. Activity For the first 2-3 days after your procedure, or as long as directed: Avoid climbing stairs as much as possible. Do not squat. Do not lift anything that is heavier than 10 lb (4.5 kg), or the limit that you are told, until your health care provider says that it is safe. For 5 days Rest as directed. Avoid sitting for a long time without moving. Get up to take short walks every 1-2 hours. Do not drive for 24 hours if you were given a medicine to help you relax (sedative). General instructions Take over-the-counter and prescription medicines only as told by your  health care provider. Keep all follow-up visits as told by your health care provider. This is important. Contact a health care provider if you have: A fever or chills. You have redness, swelling, or pain around your insertion site. Get help right away if: The catheter insertion area swells very fast. You pass out. You suddenly start to sweat or your skin gets clammy. The catheter insertion area is bleeding, and the bleeding does not stop when you hold steady pressure on the area. The area near or just beyond the catheter insertion site becomes pale, cool, tingly, or numb. These symptoms may represent a serious problem that is an emergency. Do not wait to see if the symptoms will go away. Get medical help right away. Call your local emergency services (911 in the [place] Do not drive yourself to the hospital. Summary After the procedure, it is common to have bruising that usually fades within 1-2 weeks. Check your femoral site every day for signs of infection. Do not lift anything that is heavier than 10 lb (4.5 kg), or the limit that you are told, until your health care provider says that it is safe. This information is not intended to replace advice given to you by your health care provider. Make sure you discuss any questions you have with your health care provider. Document Revised: 04/03/2017 Document Reviewed: 04/03/2017 Elsevier Patient Education  2020 [employer].

## 2022-04-19 NOTE — Progress Notes (Addendum)
   Post IR procedure today  Per Dr [name] request Plavix 75 mg #90 with 4 refills ERx to [facility] [place].  Receipt noted in computer for same  Pt is aware

## 2022-04-19 NOTE — Sedation Documentation (Signed)
Attempted to call [facility] to [facility].

## 2022-04-20 ENCOUNTER — Other Ambulatory Visit: Payer: Self-pay | Admitting: Interventional Radiology

## 2022-04-20 ENCOUNTER — Other Ambulatory Visit (HOSPITAL_COMMUNITY): Payer: Self-pay | Admitting: Student

## 2022-04-20 DIAGNOSIS — K559 Vascular disorder of intestine, unspecified: Secondary | ICD-10-CM

## 2022-05-02 ENCOUNTER — Telehealth: Payer: Medicare Other

## 2022-05-03 ENCOUNTER — Ambulatory Visit: Payer: Medicare Other | Admitting: Gastroenterology

## 2022-05-23 ENCOUNTER — Ambulatory Visit (HOSPITAL_COMMUNITY)
Admission: RE | Admit: 2022-05-23 | Discharge: 2022-05-23 | Disposition: A | Discharge status: Home / Self Care | Payer: Medicare Other | Source: Ambulatory Visit | Attending: Interventional Radiology | Admitting: Interventional Radiology

## 2022-05-23 DIAGNOSIS — K559 Vascular disorder of intestine, unspecified: Secondary | ICD-10-CM | POA: Insufficient documentation

## 2022-05-25 NOTE — Procedures (Signed)
 Office Cystoscopy Procedure Note  Indication:   Urothelial carcinoma of the bladder  Informed consent  The risks, benefits, complications, treatment options, and expected outcomes were discussed with the patient. The patient concurred with the proposed plan and provided informed consent.  Anesthesia Lidocaine  jelly 2%  Antibiotic prophylaxis  None  Procedure The patient was placed in the supine position, was prepped and draped in the usual manner using sterile technique, and 2% lidocaine  jelly instilled into the urethra.  A 17 F flexible cystoscope was then inserted into the urethra and the urethra and bladder carefully examined.  The following findings were noted:  Findings:  Urethra: Normal  Prostate: BPH with median lobe  Bladder: Normal - no visible tumors  Ureteral orifices:     -Right Normal    -Left Normal  Other findings: None    Specimens: Cytology               Complications:   None; patient tolerated the procedure well         Disposition: Home after brief observation         Condition: Stable  Attending Attestation:   I personally performed the procedure.

## 2022-05-30 ENCOUNTER — Ambulatory Visit
Admission: RE | Admit: 2022-05-30 | Discharge: 2022-05-30 | Disposition: A | Discharge status: Home / Self Care | Payer: Medicare Other | Source: Ambulatory Visit | Attending: Student | Admitting: Student

## 2022-05-30 DIAGNOSIS — K559 Vascular disorder of intestine, unspecified: Secondary | ICD-10-CM

## 2022-05-30 HISTORY — PX: IR RADIOLOGIST EVAL & MGMT: IMG5224

## 2022-05-30 NOTE — Progress Notes (Signed)
Referring Physician(s): [name], NP  Reason for follow up:  Virtual telephone clinic visit 1 month after celiac artery stent placement  History of present illness: Initial HPI from 04/06/22 "Matthew Butler is a 77 y.o. male with history of abdominal pain. He follows in [place] with [name] with whom he has undergone extensive work up for abdominal pain including EGD, colonoscopy, PPI treatment, and most recently a CTA abdomen pelvis which showed findings concerning for chronic mesenteric ischemia.   His pain is episodic and primarily post-prandial.  It is not associated to certain foods.  The pain is extreme when it occurs, nearly prompting visits to the ED.  He uses a heating pad, advil/alkaseltzer, and it takes 2-3 hrs to resolve.  Mostly moderate and tolerable pain after eating, but severe attacks have occurred 6 times in the past month, starting several months ago."  He is now status post mesenteric angiogram with celiac stent placement on 04/18/22.  The SMA demonstrated chronic long segment occlusion which was unable to be recanalized.  After the procedure he was started on 75 mg Plavix QD which he is compliant with.  He underwent follow up mesenteric duplex on 05/23/22 which demonstrated a patent celiac stent.    He reports zero abdominal pain and is very pleased with this.  He is tolerating a diet of whatever he wants to eat, given limitations with GERD. He has managed to gain 5 lbs already since the procedure.  At the time of our procedure, [name] mentioned that he has to self catheterize due to benign prostatic hyperplasia and is interested in learning more about the prostate artery embolization procedure.   He reports that for over 10 years he has had severe lower urinary tract symptoms.  He had a very weak stream, urgency, and frequency with incomplete bladder emptying.  He then developed bladder cancer in 2021 and underwent transurethral resection in 12/21 then was started on  maintenance intravesical immunotherapy (BCG) which he gets approximately every 6 months. He is under the care of Dr. Zigmund Gottron at [facility].  He was also recently seen by Urology at [facility] and is considering holep procedure for his BPH.  This past year his LUTS became so severe that he started self catheterizing, which he does at least 4x/day.  Typically once per night.  Prior to this, he has a reported very high post-void residual.  If his bladder is totally full, he can urinate some spontaneously, but with a very weak stream and unable to empty entirely.  He still atkes tamsulosin 0.4 mg QD.   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  Numerous tubular adenomas removed.  Surveillance colonoscopy advised for November 2022.   COLONOSCOPY WITH PROPOFOL N/A 09/02/2021   Procedure: COLONOSCOPY WITH PROPOFOL;  Surgeon: [name], MD;  [facility]: [facility];  Service: Endoscopy;  Laterality: N/A;  10:15am   CORONARY ANGIOPLASTY WITH STENT PLACEMENT  2018   CORONARY ARTERY BYPASS GRAFT  2011   ESOPHAGOGASTRODUODENOSCOPY N/A 03/06/2020   Procedure:  ESOPHAGOGASTRODUODENOSCOPY (EGD);  Surgeon: [name], MD;  [facility];  Service: Endoscopy;  Laterality: N/A;  11:00am   ESOPHAGOGASTRODUODENOSCOPY (EGD) WITH PROPOFOL N/A 09/02/2021   Procedure: ESOPHAGOGASTRODUODENOSCOPY (EGD) WITH PROPOFOL;  Surgeon: [name], MD;  [facility];  Service: Endoscopy;  Laterality: N/A;   IR ANGIOGRAM VISCERAL SELECTIVE  04/19/2022   IR AORTAGRAM ABDOMINAL SERIALOGRAM  04/19/2022   IR IVUS EACH ADDITIONAL NON CORONARY VESSEL  04/19/2022   IR RADIOLOGIST EVAL & MGMT  04/06/2022   IR TRANSCATH PLC STENT 1ST ART NOT LE CV CAR VERT CAR  04/19/2022   IR US GUIDE VASC ACCESS RIGHT  04/19/2022   KNEE ARTHROPLASTY     MELANOMA EXCISION  2016   Left face   POLYPECTOMY  02/28/2018   Procedure: POLYPECTOMY;  Surgeon: [name], MD;  [facility];  Service: Endoscopy;;  (colon)   POLYPECTOMY  09/02/2021   Procedure: POLYPECTOMY INTESTINAL;  Surgeon: [name], MD;  [facility];  Service: Endoscopy;;   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  Medications: Prior to Admission medications   Medication Sig Start Date End Date Taking? Authorizing Provider  aspirin EC 81 MG tablet Take 81 mg by mouth daily.    [provider]  clopidogrel (PLAVIX) 75 MG tablet Take 1 tablet (75 mg total) by mouth daily. 04/19/22   Monia Sabal, PA-C  esomeprazole (NEXIUM) 40 MG capsule Take 1 capsule by mouth twice daily 01/04/22   Mahala Menghini, PA-C  famotidine (PEPCID) 20 MG tablet Take 20 mg by mouth at bedtime. 01/03/22   [provider]  ibuprofen (ADVIL) 200 MG tablet Take 400-600 mg by mouth every 6 (six) hours as needed for moderate pain.    [provider]  nitroGLYCERIN (NITROSTAT) 0.4 MG SL tablet Place 0.4 mg under the tongue every 5 (five) minutes x 3 doses as needed for chest pain.     [provider]  predniSONE (DELTASONE) 5  MG tablet Take 5 mg by mouth daily. 01/29/18   [provider]  simvastatin (ZOCOR) 40 MG tablet Take 40 mg by mouth at bedtime.  02/24/14   [provider]  tamsulosin (FLOMAX) 0.4 MG CAPS capsule Take 0.4 mg by mouth daily. 01/30/22   [provider]     Family History  Problem Relation Age of Onset   Melanoma Maternal Grandmother    Congestive Heart Failure Mother    Deep vein thrombosis Mother    Bowel Disease Father        Volvulus   Heart attack Brother    Diabetes Brother     Social History   Socioeconomic History   Marital status: Married    Spouse name: Not on file   Number of children: 1   Years of education: Not on file   Highest education level: Not on file  Occupational History   Occupation: Retired  Comment: Retired Social research officer, government  Tobacco Use   Smoking status: Former    Types: Cigars   Smokeless tobacco: Never  Scientific laboratory technician Use: Never used  Substance and Sexual Activity   Alcohol use: Not Currently    Alcohol/week: 0.0 standard drinks of alcohol    Comment: occassional; denied 06/10/20   Drug use: No   Sexual activity: Yes    Partners: Female    Comment: spouse  Other Topics Concern   Not on file  Social History Narrative   Not on file   Social Determinants of Health   Financial Resource Strain: Not on file  Food Insecurity: Not on file  Transportation Needs: Not on file  Physical Activity: Not on file  Stress: Not on file  Social Connections: Not on file     Vital Signs: There were no vitals taken for this visit.   Imaging: 04/19/22   [place] Mesenteric 05/23/22 IMPRESSION: 1. No Doppler evidence of significant celiac artery stenosis. 2. Chronically occluded superior mesenteric artery again noted. 3. 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  Follow up duplex demonstrates a patent stent.  He is recovering very well clinically, remaining compliant with DAPT, with resolution of abdominal pain and ability to gain weight.  He additionally is interested in pursuing prostate artery embolization for his benign prostatic hyperplasia (113 g) with severe lower urinary tract symptoms (IPSS-QoL 33) that requires self-catheterization.  He prefers this lesser invasive option over surgical approach.  I think he would be an excellent candidate.    -plan for prostate artery embolization at [facility] -continue dual antiplatelet therapy  -follow up CTA abdomen/pelvis in 6 months to monitor celiac stent  Electronically Signed: Rosanne Ashing Phuc Kluttz 05/30/2022, 8:57 AM   I spent a total of 40 Minutes in virtual telephone clinical consultation, greater than 50% of which was counseling/coordinating care for chronic mesenteric ischemia, benign prostatic hyperplasia with severe lower urinary tract symptoms

## 2022-06-08 ENCOUNTER — Other Ambulatory Visit (HOSPITAL_COMMUNITY): Payer: Self-pay | Admitting: Interventional Radiology

## 2022-06-08 DIAGNOSIS — N401 Enlarged prostate with lower urinary tract symptoms: Secondary | ICD-10-CM

## 2022-06-09 ENCOUNTER — Other Ambulatory Visit: Payer: Self-pay | Admitting: Physician Assistant

## 2022-06-09 DIAGNOSIS — Z01818 Encounter for other preprocedural examination: Secondary | ICD-10-CM

## 2022-06-21 ENCOUNTER — Other Ambulatory Visit: Payer: Self-pay | Admitting: Student

## 2022-06-21 DIAGNOSIS — N400 Enlarged prostate without lower urinary tract symptoms: Secondary | ICD-10-CM

## 2022-06-22 ENCOUNTER — Other Ambulatory Visit (HOSPITAL_COMMUNITY): Payer: Self-pay | Admitting: Interventional Radiology

## 2022-06-22 ENCOUNTER — Ambulatory Visit (HOSPITAL_COMMUNITY)
Admission: RE | Admit: 2022-06-22 | Discharge: 2022-06-22 | Disposition: A | Discharge status: Home / Self Care | Payer: Medicare Other | Source: Ambulatory Visit | Attending: Interventional Radiology | Admitting: Interventional Radiology

## 2022-06-22 ENCOUNTER — Other Ambulatory Visit: Payer: Self-pay

## 2022-06-22 DIAGNOSIS — Z8571 Personal history of Hodgkin lymphoma: Secondary | ICD-10-CM | POA: Insufficient documentation

## 2022-06-22 DIAGNOSIS — Z9081 Acquired absence of spleen: Secondary | ICD-10-CM | POA: Insufficient documentation

## 2022-06-22 DIAGNOSIS — Z951 Presence of aortocoronary bypass graft: Secondary | ICD-10-CM | POA: Diagnosis not present

## 2022-06-22 DIAGNOSIS — Z8551 Personal history of malignant neoplasm of bladder: Secondary | ICD-10-CM | POA: Insufficient documentation

## 2022-06-22 DIAGNOSIS — R338 Other retention of urine: Secondary | ICD-10-CM | POA: Insufficient documentation

## 2022-06-22 DIAGNOSIS — Z87891 Personal history of nicotine dependence: Secondary | ICD-10-CM | POA: Insufficient documentation

## 2022-06-22 DIAGNOSIS — I2510 Atherosclerotic heart disease of native coronary artery without angina pectoris: Secondary | ICD-10-CM | POA: Diagnosis not present

## 2022-06-22 DIAGNOSIS — N401 Enlarged prostate with lower urinary tract symptoms: Secondary | ICD-10-CM

## 2022-06-22 DIAGNOSIS — I10 Essential (primary) hypertension: Secondary | ICD-10-CM | POA: Insufficient documentation

## 2022-06-22 DIAGNOSIS — N400 Enlarged prostate without lower urinary tract symptoms: Secondary | ICD-10-CM

## 2022-06-22 DIAGNOSIS — Z923 Personal history of irradiation: Secondary | ICD-10-CM | POA: Diagnosis not present

## 2022-06-22 DIAGNOSIS — K551 Chronic vascular disorders of intestine: Secondary | ICD-10-CM | POA: Insufficient documentation

## 2022-06-22 HISTORY — PX: IR 3D INDEPENDENT WKST: IMG2385

## 2022-06-22 HISTORY — PX: IR ANGIOGRAM PELVIS SELECTIVE OR SUPRASELECTIVE: IMG661

## 2022-06-22 HISTORY — PX: IR ANGIOGRAM SELECTIVE EACH ADDITIONAL VESSEL: IMG667

## 2022-06-22 HISTORY — PX: IR EMBO ARTERIAL NOT HEMORR HEMANG INC GUIDE ROADMAPPING: IMG5448

## 2022-06-22 HISTORY — PX: IR US GUIDE VASC ACCESS LEFT: IMG2389

## 2022-06-22 LAB — CBC
HCT: 43.9 % (ref 39.0–52.0)
Hemoglobin: 14.6 g/dL (ref 13.0–17.0)
MCH: 31.7 pg (ref 26.0–34.0)
MCHC: 33.3 g/dL (ref 30.0–36.0)
MCV: 95.2 fL (ref 80.0–100.0)
Platelets: 357 10*3/uL (ref 150–400)
RBC: 4.61 MIL/uL (ref 4.22–5.81)
RDW: 14.4 % (ref 11.5–15.5)
WBC: 10.5 10*3/uL (ref 4.0–10.5)
nRBC: 0 % (ref 0.0–0.2)

## 2022-06-22 LAB — PROTIME-INR
INR: 1 (ref 0.8–1.2)
Prothrombin Time: 13 seconds (ref 11.4–15.2)

## 2022-06-22 LAB — BASIC METABOLIC PANEL
Anion gap: 9 (ref 5–15)
BUN: 17 mg/dL (ref 8–23)
CO2: 26 mmol/L (ref 22–32)
Calcium: 8.8 mg/dL — ABNORMAL LOW (ref 8.9–10.3)
Chloride: 103 mmol/L (ref 98–111)
Creatinine, Ser: 1.05 mg/dL (ref 0.61–1.24)
GFR, Estimated: >60 mL/min (ref >60)
Glucose, Bld: 96 mg/dL (ref 70–99)
Potassium: 3.9 mmol/L (ref 3.5–5.1)
Sodium: 138 mmol/L (ref 135–145)

## 2022-06-22 LAB — PSA: Prostatic Specific Antigen: 7.97 ng/mL — ABNORMAL HIGH (ref 0.00–4.00)

## 2022-06-22 MED ORDER — VERAPAMIL HCL 2.5 MG/ML IV SOLN: INTRA_ARTERIAL | Status: AC | PRN

## 2022-06-22 MED ORDER — NAPROXEN 250 MG PO TABS
500.0000 mg | ORAL_TABLET | Freq: Once | ORAL | Status: AC
  Administered 2022-06-22: 500 mg via ORAL
  Filled 2022-06-22: qty 2

## 2022-06-22 MED ORDER — NITROGLYCERIN 1 MG/10 ML FOR IR/CATH LAB
INTRA_ARTERIAL | Status: AC
  Filled 2022-06-22: qty 10

## 2022-06-22 MED ORDER — LIDOCAINE HCL 1 % IJ SOLN
INTRAMUSCULAR | Status: AC
  Filled 2022-06-22: qty 20

## 2022-06-22 MED ORDER — SODIUM CHLORIDE 0.9 % IV SOLN: INTRAVENOUS | Status: DC

## 2022-06-22 MED ORDER — MIDAZOLAM HCL 2 MG/2ML IJ SOLN
INTRAMUSCULAR | Status: AC
  Filled 2022-06-22: qty 4

## 2022-06-22 MED ORDER — VERAPAMIL HCL 2.5 MG/ML IV SOLN
INTRAVENOUS | Status: AC
  Filled 2022-06-22: qty 2

## 2022-06-22 MED ORDER — HEPARIN SODIUM (PORCINE) 1000 UNIT/ML IJ SOLN
INTRAMUSCULAR | Status: AC
  Filled 2022-06-22: qty 10

## 2022-06-22 MED ORDER — SOLIFENACIN SUCCINATE 5 MG PO TABS: 5.0000 mg | ORAL_TABLET | Freq: Every day | ORAL | 0 refills | Status: AC

## 2022-06-22 MED ORDER — SULFAMETHOXAZOLE-TRIMETHOPRIM 400-80 MG PO TABS
1.0000 | ORAL_TABLET | Freq: Once | ORAL | Status: AC
  Administered 2022-06-22: 1 via ORAL
  Filled 2022-06-22: qty 1

## 2022-06-22 MED ORDER — CIPROFLOXACIN HCL 500 MG PO TABS: 500.0000 mg | ORAL_TABLET | Freq: Two times a day (BID) | ORAL | 0 refills | Status: AC

## 2022-06-22 MED ORDER — NITROGLYCERIN 1 MG/10 ML FOR IR/CATH LAB: INTRA_ARTERIAL | Status: AC | PRN

## 2022-06-22 MED ORDER — DEXAMETHASONE SODIUM PHOSPHATE 10 MG/ML IJ SOLN
8.0000 mg | Freq: Once | INTRAMUSCULAR | Status: AC
  Administered 2022-06-22: 8 mg via INTRAVENOUS
  Filled 2022-06-22: qty 1

## 2022-06-22 MED ORDER — MIDAZOLAM HCL 2 MG/2ML IJ SOLN
INTRAMUSCULAR | Status: AC | PRN
  Administered 2022-06-22: .5 mg via INTRAVENOUS
  Administered 2022-06-22: 1 mg via INTRAVENOUS
  Administered 2022-06-22 (×3): .5 mg via INTRAVENOUS

## 2022-06-22 MED ORDER — FENTANYL CITRATE (PF) 100 MCG/2ML IJ SOLN
INTRAMUSCULAR | Status: AC
  Filled 2022-06-22: qty 4

## 2022-06-22 MED ORDER — FENTANYL CITRATE (PF) 100 MCG/2ML IJ SOLN
INTRAMUSCULAR | Status: AC | PRN
  Administered 2022-06-22 (×3): 25 ug via INTRAVENOUS
  Administered 2022-06-22: 50 ug via INTRAVENOUS

## 2022-06-22 MED ORDER — PHENAZOPYRIDINE HCL 100 MG PO TABS: 100.0000 mg | ORAL_TABLET | Freq: Three times a day (TID) | ORAL | 0 refills | Status: AC | PRN

## 2022-06-22 NOTE — Progress Notes (Signed)
TR BAND REMOVAL  LOCATION:    right radial  DEFLATED PER PROTOCOL:    Yes.    TIME BAND OFF / DRESSING APPLIED:    1455 gauze dressing applied   SITE UPON ARRIVAL:    Level 0  SITE AFTER BAND REMOVAL:    Level 0  CIRCULATION SENSATION AND MOVEMENT:    Within Normal Limits   Yes.    COMMENTS:   no issues noted

## 2022-06-22 NOTE — H&P (Signed)
Chief Complaint: Patient was seen in consultation today for benign prostatic hyperplasia; urinary retention  Referring Physician(s): Annitta Needs.   Supervising Physician: [name]  Patient Status: [facility] - Out-pt  History of Present Illness: Matthew Butler is a 77 y.o. male with a medical history significant for Hodkins disease (1980; s/p splenectomy and radiation), HTN, CAD (s/p CABG x4 in 2011; PCI in 2018), bladder cancer (dx 2021 s/p BCG treatment), chronic mesenteric ischemia and BPH with urinary retention.   Matthew Butler is familiar to IR from treatment of mesenteric ischemia 04/19/22. Dr. [name] performed an abdominal aortogram, selective catheterization and angiography of the celiac trunk and celiac stent placement. The patient was discharged home the same day and he followed up with Dr. [name] 05/30/22 via tele-visit. Matthew Butler reported zero abdominal pain and was tolerating a regular diet without any issue or complications. It was discussed during that visit that the patient has to self-catheterize due to benign prostatic hyperplasia and the patient expressed an interest in pursuing prostate artery embolization. The patient reported that for over 10 years he has had severe lower urinary tract symptoms, a very weak stream, urgency/frequency with incomplete bladder emptying. He then developed bladder cancer in 2021 and underwent transurethral resection in 12/21 and was started on maintenance intravesical immunotherapy (BCG) which he gets approximately every 6 months. He is under the care of Dr. Zigmund Gottron at [facility]. He was also recently seen by Urology at [facility] and was considering holep procedure for his BPH. This past year his lower urinary tract symptoms became so severe that he started self catheterizing, which he does at least 4x/day. Typically once per night. Prior to this, he has a reported very high post-void residual. If his bladder is totally full, he can urinate some spontaneously, but  with a very weak stream and unable to empty entirely. He takes tamsulosin 0.4 mg QD.   After a thorough discussion of the risks and benefits of prostate artery embolization there was agreement to proceed. The patient was made aware this would be performed under moderate sedation with same day discharge.   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  Numerous tubular adenomas removed.  Surveillance colonoscopy advised for November 2022.   COLONOSCOPY WITH PROPOFOL N/A 09/02/2021   Procedure: COLONOSCOPY WITH PROPOFOL;  Surgeon: [name], MD;  [facility];  Service: Endoscopy;  Laterality: N/A;  10:15am   CORONARY ANGIOPLASTY WITH STENT PLACEMENT  2018   CORONARY ARTERY BYPASS GRAFT  2011   ESOPHAGOGASTRODUODENOSCOPY N/A 03/06/2020   Procedure: ESOPHAGOGASTRODUODENOSCOPY (EGD);  Surgeon: [name], MD;  [facility];  Service: Endoscopy;  Laterality: N/A;  11:00am   ESOPHAGOGASTRODUODENOSCOPY (EGD) WITH PROPOFOL N/A 09/02/2021  Procedure: ESOPHAGOGASTRODUODENOSCOPY (EGD) WITH PROPOFOL;  Surgeon: [name], MD;  [facility];  Service: Endoscopy;  Laterality: N/A;   IR ANGIOGRAM VISCERAL SELECTIVE  04/19/2022   IR AORTAGRAM ABDOMINAL SERIALOGRAM   04/19/2022   IR IVUS EACH ADDITIONAL NON CORONARY VESSEL  04/19/2022   IR RADIOLOGIST EVAL & MGMT  04/06/2022   IR RADIOLOGIST EVAL & MGMT  05/30/2022   IR TRANSCATH PLC STENT 1ST ART NOT LE CV CAR VERT CAR  04/19/2022   IR US GUIDE VASC ACCESS RIGHT  04/19/2022   KNEE ARTHROPLASTY     MELANOMA EXCISION  2016   Left face   POLYPECTOMY  02/28/2018   Procedure: POLYPECTOMY;  Surgeon: [name], MD;  [facility];  Service: Endoscopy;;  (colon)   POLYPECTOMY  09/02/2021   Procedure: POLYPECTOMY INTESTINAL;  Surgeon: [name], MD;  [facility];  Service: Endoscopy;;   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  Medications: Prior to Admission medications   Medication Sig Start Date End Date Taking? Authorizing Provider  aspirin EC 81 MG tablet Take 81 mg by mouth daily.   Yes [provider]  clopidogrel (PLAVIX) 75 MG tablet Take 1 tablet (75 mg total) by mouth daily. 04/19/22  Yes Monia Sabal, PA-C  esomeprazole (NEXIUM) 40 MG capsule Take 1 capsule by mouth twice daily 01/04/22  Yes Mahala Menghini, PA-C  ibuprofen (ADVIL) 200 MG tablet Take 400-600 mg by mouth every 6 (six) hours as needed for moderate pain.   Yes [provider]  simvastatin (ZOCOR) 40 MG tablet Take 40 mg by mouth at bedtime.  02/24/14  Yes [provider]  tamsulosin (FLOMAX) 0.4 MG CAPS capsule Take 0.4 mg by mouth daily. 01/30/22  Yes [provider]  famotidine (PEPCID) 20 MG tablet Take 20 mg by mouth at bedtime. 01/03/22   [provider]  nitroGLYCERIN (NITROSTAT) 0.4 MG SL tablet Place 0.4 mg under the tongue every 5 (five) minutes x 3 doses as needed for chest pain.     [provider]  predniSONE (DELTASONE) 5 MG tablet Take 5 mg by mouth daily. 01/29/18   [provider]     Family History  Problem Relation Age of Onset   Melanoma Maternal Grandmother    Congestive  Heart Failure Mother    Deep vein thrombosis Mother    Bowel Disease Father        Volvulus   Heart attack Brother    Diabetes Brother     Social History   Socioeconomic History   Marital status: Married    Spouse name: Not on file   Number of children: 1   Years of education: Not on file   Highest education level: Not on file  Occupational History   Occupation: Retired    Comment: Retired Social research officer, government  Tobacco Use   Smoking status: Former    Types: Cigars   Smokeless tobacco: Never  Scientific laboratory technician Use: Never used  Substance and Sexual Activity   Alcohol use: Not Currently    Alcohol/week: 0.0 standard drinks of alcohol    Comment: occassional; denied 06/10/20   Drug use: No   Sexual activity: Yes    Partners: Female    Comment: spouse  Other Topics Concern   Not on file  Social History Narrative   Not on file   Social Determinants of  Health   Financial Resource Strain: Not on file  Food Insecurity: Not on file  Transportation Needs: Not on file  Physical Activity: Not on file  Stress: Not on file  Social Connections: Not on file    Review of Systems: A 12 point ROS discussed and pertinent positives are indicated in the HPI above.  All other systems are negative.  Review of Systems  Constitutional:  Negative for appetite change and fatigue.  Respiratory:  Negative for cough and shortness of breath.   Cardiovascular:  Negative for chest pain and leg swelling.  Gastrointestinal:  Negative for abdominal pain, diarrhea, nausea and vomiting.  Genitourinary:  Positive for difficulty urinating.  Neurological:  Negative for dizziness and headaches.    Vital Signs: BP (!) 146/75   Pulse 84   Temp (!) 96.7 F (35.9 C) (Temporal)   Resp 14   Ht 5\' 11"  (1.803 m)   Wt 190 lb (86.2 kg)   SpO2 98%   BMI 26.50 kg/m   Physical Exam Constitutional:      General: He is not in acute distress.    Appearance: He is not ill-appearing.  HENT:     Mouth/Throat:      Mouth: Mucous membranes are moist.     Pharynx: Oropharynx is clear.  Cardiovascular:     Rate and Rhythm: Normal rate and regular rhythm.     Pulses: Normal pulses.     Heart sounds: Normal heart sounds.  Pulmonary:     Effort: Pulmonary effort is normal.     Breath sounds: Normal breath sounds.  Abdominal:     General: Bowel sounds are normal.     Palpations: Abdomen is soft.     Tenderness: There is no abdominal tenderness.  Musculoskeletal:     Right lower leg: No edema.     Left lower leg: No edema.  Skin:    General: Skin is warm and dry.  Neurological:     Mental Status: He is alert and oriented to person, place, and time.  Psychiatric:        Mood and Affect: Mood normal.        Behavior: Behavior normal.        Thought Content: Thought content normal.        Judgment: Judgment normal.     Imaging: IR Radiologist Eval & Mgmt  Result Date: 05/30/2022 EXAM: ESTABLISHED PATIENT OFFICE VISIT CHIEF COMPLAINT: See Epic note. HISTORY OF PRESENT ILLNESS: See Epic note. REVIEW OF SYSTEMS: See Epic note. PHYSICAL EXAMINATION: See Epic note. ASSESSMENT AND PLAN: See Epic note. [name], MD [facility] Electronically Signed   By: [name] M.D.   On: 05/30/2022 12:17   US ABDOMINAL PELVIC ART/VENT FLOW DOPPLER  Result Date: 05/25/2022 CLINICAL DATA:  Mesenteric ischemia Hypertension Prior CABG Former tobacco user EXAM: [place] MESENTERIC ARTERIAL DOPPLER COMPARISON:  CT angiography abdomen and pelvis 03/23/2022 FINDINGS: Celiac axis: 165 cm/sec Celiac axis with inspiration: 113 cm/sec Celiac axis with expiration: 166 cm/sec Splenic artery: 61 cm/sec Hepatic artery: 132 cm/sec SMA: Occluded IMA: 710 cm/sec Aorta: 162 cm/sec Aortic size: 3.0 cm proximally, 1.7 cm in the mid segment and 1.4 cm distally IMPRESSION: 1. No Doppler evidence of significant celiac artery stenosis. 2. Chronically occluded superior mesenteric artery  again noted. 3. 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 Electronically Signed   By: Miachel Roux M.D.   On: 05/25/2022 10:50    Labs:  CBC: Recent  Labs    07/07/21 1344 04/19/22 0727 06/22/22 0730  WBC 12.8* 11.2* 10.5  HGB 14.9 14.3 14.6  HCT 43.7 42.4 43.9  PLT 372 396 357    COAGS: Recent Labs    04/19/22 0727 06/22/22 0822  INR 1.0 1.0    BMP: Recent Labs    07/07/21 1344 04/19/22 0727  NA 138 139  K 4.3 3.6  CL 101 103  CO2 21 29  GLUCOSE 97 104*  BUN 34* 14  CALCIUM 9.4 9.1  CREATININE 1.04 1.11  GFRNONAA  --  >60    LIVER FUNCTION TESTS: Recent Labs    07/07/21 1344  BILITOT 0.7  AST 22  ALT 20  ALKPHOS 73  PROT 7.1  ALBUMIN 4.2    TUMOR MARKERS: No results for input(s): "AFPTM", "CEA", "CA199", "CHROMGRNA" in the last 8760 hours.  Assessment and Plan:  Benign prostatic hyperplasia with urinary retention requiring self-catheterization: Jethro Bolus, 77 year old male, presents today to the [place] [facility] department for an image-guided prostate artery embolization.   Risks and benefits of this procedure were discussed with the patient including, but not limited to bleeding, infection, vascular injury or contrast induced renal failure.  This interventional procedure involves the use of X-rays and because of the nature of the planned procedure, it is possible that we will have prolonged use of X-ray fluoroscopy.  Potential radiation risks to you include (but are not limited to) the following: - A slightly elevated risk for cancer  several years later in life. This risk is typically less than 0.5% percent. This risk is low in comparison to the normal incidence of human cancer, which is 33% for women and 50% for men according to the [place]. - Radiation induced injury can include skin redness, resembling a rash, tissue breakdown / ulcers and hair loss (which can be temporary or permanent).   The  likelihood of either of these occurring depends on the difficulty of the procedure and whether you are sensitive to radiation due to previous procedures, disease, or genetic conditions.   IF your procedure requires a prolonged use of radiation, you will be notified and given written instructions for further action.  It is your responsibility to monitor the irradiated area for the 2 weeks following the procedure and to notify your physician if you are concerned that you have suffered a radiation induced injury.    All of the patient's questions were answered, patient is agreeable to proceed. He has been NPO. He is a full code. His last dose of Plavix was last night.   Consent signed and in chart.  Thank you for this interesting consult.  I greatly enjoyed meeting Chike Mazzotti and look forward to participating in their care.  A copy of this report was sent to the requesting provider on this date.  Electronically Signed: Soyla Dryer, AGACNP-BC [phone] 06/22/2022, 9:13 AM   I spent a total of  30 Minutes   in face to face in clinical consultation, greater than 50% of which was counseling/coordinating care for prostate artery embolization.

## 2022-06-22 NOTE — Procedures (Signed)
Interventional Radiology Procedure Note  Procedure:  1) Pelvic and bilateral prostate artery angiography 2) Cone beam CT 3) Prostate artery embolization   Findings: Please refer to procedural dictation for full description. Bilateral prostate artery embolization with 400 micron Hydropearls.  Perfected technique on left.  Additional coil embolization on right due to patent obturator collateral.  TR band applied at 1:05 with 12 cc.  Complications: None immediate  Estimated Blood Loss: < 5 mL  Recommendations: TR band release per protocol. Foley to be removed prior to discharge. Post-procedure medications sent to pharmacy. IR will arrange follow up in clinic in 2-3 weeks.   [name], MD

## 2022-06-28 MED ORDER — IODIXANOL 320 MG/ML IV SOLN
150.0000 mL | Freq: Once | INTRAVENOUS | Status: AC | PRN
  Administered 2022-06-28: 147 mL via INTRA_ARTERIAL

## 2022-07-01 ENCOUNTER — Other Ambulatory Visit: Payer: Self-pay | Admitting: Interventional Radiology

## 2022-07-01 DIAGNOSIS — N401 Enlarged prostate with lower urinary tract symptoms: Secondary | ICD-10-CM

## 2022-07-29 ENCOUNTER — Ambulatory Visit
Admission: RE | Admit: 2022-07-29 | Discharge: 2022-07-29 | Disposition: A | Discharge status: Home / Self Care | Payer: Medicare Other | Source: Ambulatory Visit | Attending: Interventional Radiology | Admitting: Interventional Radiology

## 2022-07-29 DIAGNOSIS — N401 Enlarged prostate with lower urinary tract symptoms: Secondary | ICD-10-CM

## 2022-07-29 HISTORY — PX: IR RADIOLOGIST EVAL & MGMT: IMG5224

## 2022-07-29 NOTE — Progress Notes (Signed)
Referring Physician(s): Lewie Loron, NP   Reason for follow up:  Virtual telephone clinic visit 3 months after celiac artery stent placement, 1 month after prostate artery embolization   History of present illness: Initial HPI from 04/06/22 "Matthew Butler is a 76 y.o. male with history of abdominal pain. He follows in [place] with Lewie Loron with whom he has undergone extensive work up for abdominal pain including EGD, colonoscopy, PPI treatment, and most recently a CTA abdomen pelvis which showed findings concerning for chronic mesenteric ischemia.   His pain is episodic and primarily post-prandial.  It is not associated to certain foods.  The pain is extreme when it occurs, nearly prompting visits to the ED.  He uses a heating pad, advil/alkaseltzer, and it takes 2-3 hrs to resolve.  Mostly moderate and tolerable pain after eating, but severe attacks have occurred 6 times in the past month, starting several months ago."  HPI from 05/30/22  He is now status post mesenteric angiogram with celiac stent placement on 04/18/22.  The SMA demonstrated chronic long segment occlusion which was unable to be recanalized.  After the procedure he was started on 75 mg Plavix QD which he is compliant with.  He underwent follow up mesenteric duplex on 05/23/22 which demonstrated a patent celiac stent.     He reports zero abdominal pain and is very pleased with this.  He is tolerating a diet of whatever he wants to eat, given limitations with GERD. He has managed to gain 5 lbs already since the procedure.   At the time of our procedure, Matthew Butler mentioned that he has to self catheterize due to benign prostatic hyperplasia and is interested in learning more about the prostate artery embolization procedure.   He reports that for over 10 years he has had severe lower urinary tract symptoms.  He had a very weak stream, urgency, and frequency with incomplete bladder emptying.  He then developed bladder cancer in 2021 and  underwent transurethral resection in 12/21 then was started on maintenance intravesical immunotherapy (BCG) which he gets approximately every 6 months. He is under the care of Dr. Armandina Stammer at [facility].  He was also recently seen by Urology at [facility] and is considering holep procedure for his BPH.  This past year his LUTS became so severe that he started self catheterizing, which he does at least 4x/day.  Typically once per night.  Prior to this, he has a reported very high post-void residual.  If his bladder is totally full, he can urinate some spontaneously, but with a very weak stream and unable to empty entirely.  He still atkes tamsulosin 0.4 mg QD.    IPSS-QoL Incomplete emptying - 5 Frequency - 5 Intermittency - 5 Urgency - 4 Weak Stream - 5 Straining - 5 Nocturia - 4 QoL - 6 =33/6  He underwent bilateral prostate artery embolization via left radial artery approach on 06/22/22.  He has recovered well.  He has begun to micturate more and more since the procedure but does still have to self catheterize, typically to completely empty his bladder.  He also catheterizes prior to going to bed, and usually once overnight.       He is still taking DAPT, and quit taking nexium due to interactions.  He has had some mild GERD but states that it's mostly manageable with dietary modifications.  No new or recurrent abdominal pain.   Past Medical History:  Diagnosis Date   BPH (benign prostatic hyperplasia)  Coronary artery disease 2011   GERD (gastroesophageal reflux disease)    History of pulmonary embolus (PE)    After coronary artery bypass graft   Hodgkin disease (HCC)    1980s, status post splenectomy and radiation   HTN (hypertension)    Hyperlipidemia     Past Surgical History:  Procedure Laterality Date   APPENDECTOMY     BIOPSY  03/06/2020   Procedure: BIOPSY;  Surgeon: Corbin Ade, MD;  Location: [facility];  Service: Endoscopy;;   CHOLECYSTECTOMY N/A 04/18/2014    Procedure: LAPAROSCOPIC CHOLECYSTECTOMY;  Surgeon: Dalia Heading, MD;  Location: [facility];  Service: General;  Laterality: N/A;   COLONOSCOPY N/A 02/28/2018   [place]: Diverticulosis.  Numerous tubular adenomas removed.  Surveillance colonoscopy advised for November 2022.   COLONOSCOPY WITH PROPOFOL N/A 09/02/2021   Procedure: COLONOSCOPY WITH PROPOFOL;  Surgeon: Corbin Ade, MD;  Location: [facility];  Service: Endoscopy;  Laterality: N/A;  10:15am   CORONARY ANGIOPLASTY WITH STENT PLACEMENT  2018   CORONARY ARTERY BYPASS GRAFT  2011   ESOPHAGOGASTRODUODENOSCOPY N/A 03/06/2020   Procedure: ESOPHAGOGASTRODUODENOSCOPY (EGD);  Surgeon: Corbin Ade, MD;  [facility];  Service: Endoscopy;  Laterality: N/A;  11:00am   ESOPHAGOGASTRODUODENOSCOPY (EGD) WITH PROPOFOL N/A 09/02/2021   Procedure: ESOPHAGOGASTRODUODENOSCOPY (EGD) WITH PROPOFOL;  Surgeon: Corbin Ade, MD;  [facility];  Service: Endoscopy;  Laterality: N/A;   IR 3D INDEPENDENT WKST  06/22/2022   IR 3D INDEPENDENT WKST  06/22/2022   IR ANGIOGRAM PELVIS SELECTIVE OR SUPRASELECTIVE  06/22/2022   IR ANGIOGRAM PELVIS SELECTIVE OR SUPRASELECTIVE  06/22/2022   IR ANGIOGRAM SELECTIVE EACH ADDITIONAL VESSEL  06/22/2022   IR ANGIOGRAM SELECTIVE EACH ADDITIONAL VESSEL  06/22/2022   IR ANGIOGRAM VISCERAL SELECTIVE  04/19/2022   IR AORTAGRAM ABDOMINAL SERIALOGRAM  04/19/2022   IR EMBO ARTERIAL NOT HEMORR HEMANG INC GUIDE ROADMAPPING  06/22/2022   IR IVUS EACH ADDITIONAL NON CORONARY VESSEL  04/19/2022   IR RADIOLOGIST EVAL & MGMT  04/06/2022   IR RADIOLOGIST EVAL & MGMT  05/30/2022   IR TRANSCATH PLC STENT 1ST ART NOT LE CV CAR VERT CAR  04/19/2022   IR US GUIDE VASC ACCESS LEFT  06/22/2022   IR US GUIDE VASC ACCESS RIGHT  04/19/2022   KNEE ARTHROPLASTY     MELANOMA EXCISION  2016   Left face   POLYPECTOMY  02/28/2018   Procedure: POLYPECTOMY;  Surgeon: Corbin Ade, MD;  [facility];  Service: Endoscopy;;  (colon)    POLYPECTOMY  09/02/2021   Procedure: POLYPECTOMY INTESTINAL;  Surgeon: Corbin Ade, MD;  [facility];  Service: Endoscopy;;   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  Medications: Prior to Admission medications   Medication Sig Start Date End Date Taking? Authorizing Provider  aspirin EC 81 MG tablet Take 81 mg by mouth daily.    [provider]  clopidogrel (PLAVIX) 75 MG tablet Take 1 tablet (75 mg total) by mouth daily. 04/19/22   Ralene Muskrat, PA-C  famotidine (PEPCID) 20 MG tablet Take 20 mg by mouth at bedtime. 01/03/22   [provider]  ibuprofen (ADVIL) 200 MG tablet Take 400-600 mg by mouth every 6 (six) hours as needed for moderate pain.    [provider]  nitroGLYCERIN (NITROSTAT) 0.4 MG SL tablet Place 0.4 mg under the tongue every 5 (five) minutes x 3  doses as needed for chest pain.     [provider]  predniSONE (DELTASONE) 5 MG tablet Take 5 mg by mouth daily. 01/29/18   [provider]  simvastatin (ZOCOR) 40 MG tablet Take 40 mg by mouth at bedtime.  02/24/14   [provider]  tamsulosin (FLOMAX) 0.4 MG CAPS capsule Take 0.4 mg by mouth daily. 01/30/22   [provider]     Family History  Problem Relation Age of Onset   Melanoma Maternal Grandmother    Congestive Heart Failure Mother    Deep vein thrombosis Mother    Bowel Disease Father        Volvulus   Heart attack Brother    Diabetes Brother     Social History   Socioeconomic History   Marital status: Married    Spouse name: Not on file   Number of children: 1   Years of education: Not on file   Highest education level: Not on file  Occupational History   Occupation: Retired    Comment: Retired Company secretary  Tobacco Use   Smoking status: Former    Types: Cigars   Smokeless tobacco: Never  Building services engineer Use: Never used  Substance and Sexual Activity   Alcohol  use: Not Currently    Alcohol/week: 0.0 standard drinks of alcohol    Comment: occassional; denied 06/10/20   Drug use: No   Sexual activity: Yes    Partners: Female    Comment: spouse  Other Topics Concern   Not on file  Social History Narrative   Not on file   Social Determinants of Health   Financial Resource Strain: Not on file  Food Insecurity: Not on file  Transportation Needs: Not on file  Physical Activity: Not on file  Stress: Not on file  Social Connections: Not on file     Vital Signs: There were no vitals taken for this visit.  No physical examination was performed in lieu of virtual telephone clinic visit.  Imaging: 04/19/22    [place] Mesenteric 05/23/22 IMPRESSION: 1. No Doppler evidence of significant celiac artery stenosis. 2. Chronically occluded superior mesenteric artery again noted. 3. 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    CT [facility] 03/31/22   5.8 x 5.1 x 7.3 cm = 113 g    Labs:  CBC: Recent Labs    04/19/22 0727 06/22/22 0730  WBC 11.2* 10.5  HGB 14.3 14.6  HCT 42.4 43.9  PLT 396 357    COAGS: Recent Labs    04/19/22 0727 06/22/22 0822  INR 1.0 1.0    BMP: Recent Labs    04/19/22 0727 06/22/22 0822  NA 139 138  K 3.6 3.9  CL 103 103  CO2 29 26  GLUCOSE 104* 96  BUN 14 17  CALCIUM 9.1 8.8*  CREATININE 1.11 1.05  GFRNONAA >60 >60    LIVER FUNCTION TESTS: No results for input(s): "BILITOT", "AST", "ALT", "ALKPHOS", "PROT", "ALBUMIN" in the last 8760 hours.  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  Follow up duplex demonstrates a patent stent.  He is recovering very well clinically, remaining compliant with DAPT, with resolution of abdominal pain and ability to gain weight.   He is now status post prostate artery embolization (06/22/22) for his benign  prostatic hyperplasia (113 g) with  severe lower urinary tract symptoms that requires self-catheterization.  In one month, IPSS/QoL has improved from 33/6 to 14/3 with improved ability to void independently.  I suspect this will continue to improved over the next several months.  Plan for CTA abdomen/pelvis ~July to follow up celiac stent and assess prostate size - he states that he has a CT planned at [facility] for monitoring of his bladder cancer in June.  We will reach out to Dr. Laverta [place] office to see if this can be changed to a CTA abdomen/pelvis and sent to our PACS system once obtained.  If this is unable to be done, he will require CTA abdomen/pelvis locally in July.  Follow up in IR clinic once obtained.   Electronically Signed: Bennie Dallas 07/29/2022, 7:38 AM   I spent a total of 25 Minutes in virtual telephone clinical consultation, greater than 50% of which was counseling/coordinating care for chronic mesenteric ischemia and benign prostatic hyperplasia with lower urinary tract symptoms.

## 2022-08-29 NOTE — Progress Notes (Incomplete)
ADVANCED HF CLINIC NOTE  Primary Care: Alinda Deem, MD Primary Cardiologist: Vonzell Schlatter, MD  HPI:  Mr. Hamacher is a 77 y/o male [place] Vet with Agent Orange exposure with CAD s/p CABG 2011 followed bp PCI in 2018, GERD/PUD, Hodgkin's Lymphoma in 1980s s/p splenectomy and XRT (no chemo), bladder CA 2/22.  Has been following with Dr. Earna Coder. Recently has been feeling dizzy and fatigued. Dr. Earna Coder stopped Flomax but no response. Went to [place] and they ordered echo and was told he had leaky valves  In 2018 collapsed from heat stroke. Had cath as part of the w/u and received a stent.    Has had 9 BCG infusions. Self caths himself 4x/day.Gets 400cc of urine. No fevers or chills. No fevers or chills.   I saw him for first time in 5/23 at the request of Mr. Tami Ribas. I felt he had orthostatic hypotension. Encouraged liberalization of salt and fluid and compression stockings. Trying to avoid midodrine for now if possible given resting HTN.  Since we last saw him he has underwent: - Mesenteric angiogram with celiac stent placement on 04/18/22.  The SMA demonstrated chronic long segment occlusion which was unable to be recanalized.  After the procedure he was started on 75 mg Plavix QD which he is compliant with.  He underwent follow up mesenteric duplex on 05/23/22 which demonstrated a patent celiac stent.   - Bilateral prostate artery embolization on 06/22/22 for BPH  Echo today 08/30/22: EF 55-60% mild MR/AI Personally reviewed   Here for f/u. Says he feels ok. Getting BCG infusions for bladder Ca. Recent cytology with persistent cancer cells. Denies CP. Mild DOE. Continues to self cath himself. Tires easily. Has to take frequent rest breaks. SBP running 120-160s. Orthostasis much improved. Mesenteric stent helping a lot.    Past Medical History:  Diagnosis Date   BPH (benign prostatic hyperplasia)    Coronary artery disease 2011   GERD (gastroesophageal reflux disease)    History of  pulmonary embolus (PE)    After coronary artery bypass graft   Hodgkin disease (HCC)    1980s, status post splenectomy and radiation   HTN (hypertension)    Hyperlipidemia     Current Outpatient Medications  Medication Sig Dispense Refill   aspirin EC 81 MG tablet Take 81 mg by mouth daily.     clopidogrel (PLAVIX) 75 MG tablet Take 1 tablet (75 mg total) by mouth daily. 90 tablet 4   ibuprofen (ADVIL) 200 MG tablet Take 400-600 mg by mouth every 6 (six) hours as needed for moderate pain.     nitroGLYCERIN (NITROSTAT) 0.4 MG SL tablet Place 0.4 mg under the tongue every 5 (five) minutes x 3 doses as needed for chest pain.      simvastatin (ZOCOR) 40 MG tablet Take 40 mg by mouth at bedtime.      tamsulosin (FLOMAX) 0.4 MG CAPS capsule Take 0.4 mg by mouth daily.     predniSONE (DELTASONE) 5 MG tablet Take 5 mg by mouth daily. (Patient not taking: Reported on 08/30/2022)  1   No current facility-administered medications for this encounter.    No Known Allergies    Social History   Socioeconomic History   Marital status: Married    Spouse name: Not on file   Number of children: 1   Years of education: Not on file   Highest education level: Not on file  Occupational History   Occupation: Retired    Comment: Retired Company secretary  Tobacco Use   Smoking status: Former    Types: Cigars   Smokeless tobacco: Never  Vaping Use   Vaping Use: Never used  Substance and Sexual Activity   Alcohol use: Not Currently    Alcohol/week: 0.0 standard drinks of alcohol    Comment: occassional; denied 06/10/20   Drug use: No   Sexual activity: Yes    Partners: Female    Comment: spouse  Other Topics Concern   Not on file  Social History Narrative   Not on file   Social Determinants of Health   Financial Resource Strain: Not on file  Food Insecurity: Not on file  Transportation Needs: Not on file  Physical Activity: Not on file  Stress: Not on file  Social Connections: Not on file   Intimate Partner Violence: Not on file      Family History  Problem Relation Age of Onset   Melanoma Maternal Grandmother    Congestive Heart Failure Mother    Deep vein thrombosis Mother    Bowel Disease Father        Volvulus   Heart attack Brother    Diabetes Brother     Vitals:   08/30/22 0957 08/30/22 1038 08/30/22 1039  BP: (!) 142/72    Pulse: 74    SpO2: 94% 95% 95%  Weight: 90.9 kg (200 lb 6.4 oz)      BP sitting 160/80 BP standing 160/80  PHYSICAL EXAM: General:  Well appearing. No resp difficulty HEENT: normal Neck: supple. no JVD. Carotids 2+ bilat; no bruits. No lymphadenopathy or thryomegaly appreciated. Cor: PMI nondisplaced. Regular rate & rhythm. No rubs, gallops or murmurs. Lungs: clear Abdomen: soft, nontender, nondistended. No hepatosplenomegaly. No bruits or masses. Good bowel sounds. Extremities: no cyanosis, clubbing, rash, edema Neuro: alert & orientedx3, cranial nerves grossly intact. moves all 4 extremities w/o difficulty. Affect pleasant   ECG: NSR 82 + PACs  +LVH/ No ST-T wave abnormalities. Personally reviewed  ASSESSMENT & PLAN:   1. Orthostatic hypotension - Improved with liberalization of salt and fluid. No longer orthostatic on exam. - Now hypertensive - Keep BP log if BP consistently elevated can try low dose losartan (12.5-25 mg) to keep SBP 130-140  2. CAD - s/p CABG 2011 followed bp PCI in 2018 - no s/s angina - continue ASA/statin   3. Snoring  - suspect underlying OSA - refused sleep study  4. Valvular heart disease - exam ok - says he had an echo at [facility] and had leaky had valves.  - Echo today EF 55-60% mild MR/AI/PI -> follow  5. PAD with chronic mesenteric ischemia - s/p celiac artery stent placement 1/24  6. Bladder Ca - followed by Ebony Cargo, MD  10:48 AM

## 2022-08-30 ENCOUNTER — Encounter (HOSPITAL_COMMUNITY): Payer: Self-pay | Admitting: Internal Medicine

## 2022-08-30 ENCOUNTER — Ambulatory Visit (HOSPITAL_BASED_OUTPATIENT_CLINIC_OR_DEPARTMENT_OTHER)
Admission: RE | Admit: 2022-08-30 | Discharge: 2022-08-30 | Disposition: A | Discharge status: Home / Self Care | Payer: Medicare Other | Source: Ambulatory Visit | Attending: Internal Medicine | Admitting: Internal Medicine

## 2022-08-30 ENCOUNTER — Ambulatory Visit (HOSPITAL_COMMUNITY)
Admission: RE | Admit: 2022-08-30 | Discharge: 2022-08-30 | Disposition: A | Discharge status: Home / Self Care | Payer: Medicare Other | Source: Ambulatory Visit | Attending: Internal Medicine | Admitting: Internal Medicine

## 2022-08-30 VITALS — BP 142/72 | HR 74 | Wt 200.4 lb

## 2022-08-30 DIAGNOSIS — I509 Heart failure, unspecified: Secondary | ICD-10-CM

## 2022-08-30 DIAGNOSIS — Z8571 Personal history of Hodgkin lymphoma: Secondary | ICD-10-CM | POA: Diagnosis not present

## 2022-08-30 DIAGNOSIS — R0683 Snoring: Secondary | ICD-10-CM | POA: Diagnosis not present

## 2022-08-30 DIAGNOSIS — I110 Hypertensive heart disease with heart failure: Secondary | ICD-10-CM | POA: Insufficient documentation

## 2022-08-30 DIAGNOSIS — Z8711 Personal history of peptic ulcer disease: Secondary | ICD-10-CM | POA: Insufficient documentation

## 2022-08-30 DIAGNOSIS — I080 Rheumatic disorders of both mitral and aortic valves: Secondary | ICD-10-CM | POA: Diagnosis not present

## 2022-08-30 DIAGNOSIS — Z7902 Long term (current) use of antithrombotics/antiplatelets: Secondary | ICD-10-CM | POA: Insufficient documentation

## 2022-08-30 DIAGNOSIS — Z923 Personal history of irradiation: Secondary | ICD-10-CM | POA: Diagnosis not present

## 2022-08-30 DIAGNOSIS — C679 Malignant neoplasm of bladder, unspecified: Secondary | ICD-10-CM | POA: Diagnosis not present

## 2022-08-30 DIAGNOSIS — Z9081 Acquired absence of spleen: Secondary | ICD-10-CM | POA: Insufficient documentation

## 2022-08-30 DIAGNOSIS — I10 Essential (primary) hypertension: Secondary | ICD-10-CM

## 2022-08-30 DIAGNOSIS — I2510 Atherosclerotic heart disease of native coronary artery without angina pectoris: Secondary | ICD-10-CM | POA: Insufficient documentation

## 2022-08-30 DIAGNOSIS — I739 Peripheral vascular disease, unspecified: Secondary | ICD-10-CM | POA: Diagnosis not present

## 2022-08-30 DIAGNOSIS — E785 Hyperlipidemia, unspecified: Secondary | ICD-10-CM | POA: Insufficient documentation

## 2022-08-30 DIAGNOSIS — Z951 Presence of aortocoronary bypass graft: Secondary | ICD-10-CM | POA: Diagnosis not present

## 2022-08-30 DIAGNOSIS — Z8249 Family history of ischemic heart disease and other diseases of the circulatory system: Secondary | ICD-10-CM | POA: Insufficient documentation

## 2022-08-30 DIAGNOSIS — K219 Gastro-esophageal reflux disease without esophagitis: Secondary | ICD-10-CM | POA: Diagnosis not present

## 2022-08-30 DIAGNOSIS — I951 Orthostatic hypotension: Secondary | ICD-10-CM | POA: Insufficient documentation

## 2022-08-30 DIAGNOSIS — Z87891 Personal history of nicotine dependence: Secondary | ICD-10-CM | POA: Diagnosis not present

## 2022-08-30 LAB — ECHOCARDIOGRAM COMPLETE
AV Vena cont: 0.3 cm
Area-P 1/2: 4.21 cm2
Calc EF: 55.7 %
P 1/2 time: 389 msec
S' Lateral: 3.1 cm
Single Plane A2C EF: 54.3 %
Single Plane A4C EF: 56.3 %

## 2022-08-30 NOTE — Patient Instructions (Signed)
There has been no changes to your medications.  Your physician recommends that you schedule a follow-up appointment in: 1 year ( May 2025) ** please call the office in February 2025 to arrange your follow up appointment. **  If you have any questions or concerns before your next appointment please send [place] a message through [place] or call our office at [phone].    TO LEAVE A MESSAGE FOR THE NURSE SELECT OPTION 2, PLEASE LEAVE A MESSAGE INCLUDING: YOUR NAME DATE OF BIRTH CALL BACK NUMBER REASON FOR CALL**this is important as we prioritize the call backs  YOU WILL RECEIVE A CALL BACK THE SAME DAY AS LONG AS YOU CALL BEFORE 4:00 PM  At the [facility], you and your health needs are our priority. As part of our continuing mission to provide you with exceptional heart care, we have created designated Provider Care Teams. These Care Teams include your primary Cardiologist (physician) and Advanced Practice Providers (APPs- Physician Assistants and Nurse Practitioners) who all work together to provide you with the care you need, when you need it.   You may see any of the following providers on your designated Care Team at your next follow up: Dr Arvilla Meres Dr Marca Ancona Dr. Marcos Eke, NP Robbie Lis, [place] [facility], [place] Brynda Peon, NP Karle Plumber, PharmD   Please be sure to bring in all your medications bottles to every appointment.    Thank you for choosing [facility]

## 2022-08-30 NOTE — Progress Notes (Signed)
Echocardiogram 2D Echocardiogram has been performed.  Matthew Butler 08/30/2022, 9:55 AM

## 2022-11-03 ENCOUNTER — Other Ambulatory Visit: Payer: Self-pay | Admitting: Interventional Radiology

## 2022-11-03 DIAGNOSIS — N400 Enlarged prostate without lower urinary tract symptoms: Secondary | ICD-10-CM

## 2022-12-22 ENCOUNTER — Ambulatory Visit (HOSPITAL_COMMUNITY)
Admission: RE | Admit: 2022-12-22 | Discharge: 2022-12-22 | Disposition: A | Discharge status: Home / Self Care | Payer: Medicare Other | Source: Ambulatory Visit | Attending: Interventional Radiology | Admitting: Interventional Radiology

## 2022-12-22 DIAGNOSIS — N400 Enlarged prostate without lower urinary tract symptoms: Secondary | ICD-10-CM | POA: Insufficient documentation

## 2022-12-22 LAB — POCT I-STAT CREATININE: Creatinine, Ser: 1.3 mg/dL — ABNORMAL HIGH (ref 0.61–1.24)

## 2022-12-22 MED ORDER — IOHEXOL 350 MG/ML SOLN
100.0000 mL | Freq: Once | INTRAVENOUS | Status: AC | PRN
  Administered 2022-12-22: 100 mL via INTRAVENOUS

## 2023-01-04 ENCOUNTER — Ambulatory Visit
Admission: RE | Admit: 2023-01-04 | Discharge: 2023-01-04 | Disposition: A | Discharge status: Home / Self Care | Payer: Medicare Other | Source: Ambulatory Visit | Attending: Interventional Radiology | Admitting: Interventional Radiology

## 2023-01-04 ENCOUNTER — Telehealth (HOSPITAL_COMMUNITY): Payer: Self-pay | Admitting: Student

## 2023-01-04 DIAGNOSIS — N400 Enlarged prostate without lower urinary tract symptoms: Secondary | ICD-10-CM

## 2023-01-04 HISTORY — PX: IR RADIOLOGIST EVAL & MGMT: IMG5224

## 2023-01-04 MED ORDER — TAMSULOSIN HCL 0.4 MG PO CAPS: 0.4000 mg | ORAL_CAPSULE | Freq: Every day | ORAL | 2 refills | Status: AC

## 2023-01-04 NOTE — Progress Notes (Signed)
Referring Physician(s): Lewie Loron, NP   Reason for follow up:  Virtual telephone clinic visit 3 months after celiac artery stent placement, 1 month after prostate artery embolization   History of present illness: Initial HPI from 04/06/22 "Matthew Butler is a 77 y.o. male with history of abdominal pain. He follows in [place] with Lewie Loron with whom he has undergone extensive work up for abdominal pain including EGD, colonoscopy, PPI treatment, and most recently a CTA abdomen pelvis which showed findings concerning for chronic mesenteric ischemia.   His pain is episodic and primarily post-prandial.  It is not associated to certain foods.  The pain is extreme when it occurs, nearly prompting visits to the ED.  He uses a heating pad, advil/alkaseltzer, and it takes 2-3 hrs to resolve.  Mostly moderate and tolerable pain after eating, but severe attacks have occurred 6 times in the past month, starting several months ago."   HPI from 05/30/22  He is now status post mesenteric angiogram with celiac stent placement on 04/18/22.  The SMA demonstrated chronic long segment occlusion which was unable to be recanalized.  After the procedure he was started on 75 mg Plavix QD which he is compliant with.  He underwent follow up mesenteric duplex on 05/23/22 which demonstrated a patent celiac stent.     He reports zero abdominal pain and is very pleased with this.  He is tolerating a diet of whatever he wants to eat, given limitations with GERD. He has managed to gain 5 lbs already since the procedure.   At the time of our procedure, Mr. Voorheis mentioned that he has to self catheterize due to benign prostatic hyperplasia and is interested in learning more about the prostate artery embolization procedure.   He reports that for over 10 years he has had severe lower urinary tract symptoms.  He had a very weak stream, urgency, and frequency with incomplete bladder emptying.  He then developed bladder cancer in 2021 and  underwent transurethral resection in 12/21 then was started on maintenance intravesical immunotherapy (BCG) which he gets approximately every 6 months. He is under the care of Dr. Armandina Stammer at [facility].  He was also recently seen by Urology at [facility] and is considering holep procedure for his BPH.  This past year his LUTS became so severe that he started self catheterizing, which he does at least 4x/day.  Typically once per night.  Prior to this, he has a reported very high post-void residual.  If his bladder is totally full, he can urinate some spontaneously, but with a very weak stream and unable to empty entirely.  He still atkes tamsulosin 0.4 mg QD.    IPSS-QoL Incomplete emptying - 5 Frequency - 5 Intermittency - 5 Urgency - 4 Weak Stream - 5 Straining - 5 Nocturia - 4 QoL - 6 =33/6   He underwent bilateral prostate artery embolization via left radial artery approach on 06/22/22.  He has recovered well.  He initially improved considerably but has to self catheterize more now, approximately 3-4x per day. He has a trickle when he does void independently.  He reports approximately 2x noctruia.   He feels that he can never completely empty his bladder. He continues to follow with Dr. Sherryll Burger at [facility] for bladder cancer.  He is not currently taking Flomax due to running out of prescription and former urologist unwilling to provide script. IPSS not performed today due to frequent self catheterizations.  From an abdominal standpoint he is doing  well without abdominal pain or post-prandial pain separate from heartburn which has been a chronic issue for him. He recently switched from nexium to pantoprazole.  He has been able to keep his weight on.      Past Medical History:  Diagnosis Date   BPH (benign prostatic hyperplasia)    Coronary artery disease 2011   GERD (gastroesophageal reflux disease)    History of pulmonary embolus (PE)    After coronary artery bypass graft   Hodgkin disease (HCC)     1980s, status post splenectomy and radiation   HTN (hypertension)    Hyperlipidemia     Past Surgical History:  Procedure Laterality Date   APPENDECTOMY     BIOPSY  03/06/2020   Procedure: BIOPSY;  Surgeon: Corbin Ade, MD;  [facility];  Service: Endoscopy;;   CHOLECYSTECTOMY N/A 04/18/2014   Procedure: LAPAROSCOPIC CHOLECYSTECTOMY;  Surgeon: Dalia Heading, MD;  Location: [facility];  Service: General;  Laterality: N/A;   COLONOSCOPY N/A 02/28/2018   Rourk: Diverticulosis.  Numerous tubular adenomas removed.  Surveillance colonoscopy advised for November 2022.   COLONOSCOPY WITH PROPOFOL N/A 09/02/2021   Procedure: COLONOSCOPY WITH PROPOFOL;  Surgeon: Corbin Ade, MD;  [facility];  Service: Endoscopy;  Laterality: N/A;  10:15am   CORONARY ANGIOPLASTY WITH STENT PLACEMENT  2018   CORONARY ARTERY BYPASS GRAFT  2011   ESOPHAGOGASTRODUODENOSCOPY N/A 03/06/2020   Procedure: ESOPHAGOGASTRODUODENOSCOPY (EGD);  Surgeon: Corbin Ade, MD;  [facility];  Service: Endoscopy;  Laterality: N/A;  11:00am   ESOPHAGOGASTRODUODENOSCOPY (EGD) WITH PROPOFOL N/A 09/02/2021   Procedure: ESOPHAGOGASTRODUODENOSCOPY (EGD) WITH PROPOFOL;  Surgeon: Corbin Ade, MD;  [facility];  Service: Endoscopy;  Laterality: N/A;   IR 3D INDEPENDENT WKST  06/22/2022   IR 3D INDEPENDENT WKST  06/22/2022   IR ANGIOGRAM PELVIS SELECTIVE OR SUPRASELECTIVE  06/22/2022   IR ANGIOGRAM PELVIS SELECTIVE OR SUPRASELECTIVE  06/22/2022   IR ANGIOGRAM SELECTIVE EACH ADDITIONAL VESSEL  06/22/2022   IR ANGIOGRAM SELECTIVE EACH ADDITIONAL VESSEL  06/22/2022   IR ANGIOGRAM VISCERAL SELECTIVE  04/19/2022   IR AORTAGRAM ABDOMINAL SERIALOGRAM  04/19/2022   IR EMBO ARTERIAL NOT HEMORR HEMANG INC GUIDE ROADMAPPING  06/22/2022   IR IVUS EACH ADDITIONAL NON CORONARY VESSEL  04/19/2022   IR RADIOLOGIST EVAL & MGMT  04/06/2022   IR RADIOLOGIST EVAL & MGMT  05/30/2022   IR RADIOLOGIST EVAL & MGMT   07/29/2022   IR TRANSCATH PLC STENT 1ST ART NOT LE CV CAR VERT CAR  04/19/2022   IR US GUIDE VASC ACCESS LEFT  06/22/2022   IR US GUIDE VASC ACCESS RIGHT  04/19/2022   KNEE ARTHROPLASTY     MELANOMA EXCISION  2016   Left face   POLYPECTOMY  02/28/2018   Procedure: POLYPECTOMY;  Surgeon: Corbin Ade, MD;  [facility];  Service: Endoscopy;;  (colon)   POLYPECTOMY  09/02/2021   Procedure: POLYPECTOMY INTESTINAL;  Surgeon: Corbin Ade, MD;  [facility];  Service: Endoscopy;;   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  Medications: Prior to Admission medications   Medication Sig Start Date End Date Taking? Authorizing Provider  aspirin EC 81 MG tablet Take 81 mg by mouth daily.    [provider]  clopidogrel (PLAVIX) 75 MG tablet Take 1 tablet (75 mg total) by mouth daily. 04/19/22   Ralene Muskrat, PA-C  ibuprofen (ADVIL) 200 MG tablet Take 400-600 mg by mouth every 6 (six) hours as needed for moderate pain.    [provider]  nitroGLYCERIN (NITROSTAT) 0.4 MG SL tablet Place 0.4 mg under the tongue every 5 (five) minutes x 3 doses as needed for chest pain.     [provider]  predniSONE (DELTASONE) 5 MG tablet Take 5 mg by mouth daily. Patient not taking: Reported on 08/30/2022 01/29/18   [provider]  simvastatin (ZOCOR) 40 MG tablet Take 40 mg by mouth at bedtime.  02/24/14   [provider]  tamsulosin (FLOMAX) 0.4 MG CAPS capsule Take 0.4 mg by mouth daily. 01/30/22   [provider]     Family History  Problem Relation Age of Onset   Melanoma Maternal Grandmother    Congestive Heart Failure Mother    Deep vein thrombosis Mother    Bowel Disease Father        Volvulus   Heart attack Brother    Diabetes Brother     Social History   Socioeconomic History   Marital status: Married    Spouse name: Not on file   Number of children: 1   Years of  education: Not on file   Highest education level: Not on file  Occupational History   Occupation: Retired    Comment: Retired Company secretary  Tobacco Use   Smoking status: Former    Types: Cigars   Smokeless tobacco: Never  Vaping Use   Vaping status: Never Used  Substance and Sexual Activity   Alcohol use: Not Currently    Alcohol/week: 0.0 standard drinks of alcohol    Comment: occassional; denied 06/10/20   Drug use: No   Sexual activity: Yes    Partners: Female    Comment: spouse  Other Topics Concern   Not on file  Social History Narrative   Not on file   Social Determinants of Health   Financial Resource Strain: Not on file  Food Insecurity: Not on file  Transportation Needs: Not on file  Physical Activity: Not on file  Stress: Not on file  Social Connections: Not on file     Vital Signs: There were no vitals taken for this visit.  No physical examination was performed in lieu of virtual telephone clinic visit.   Imaging: 04/19/22    [place] Mesenteric 05/23/22 IMPRESSION: 1. No Doppler evidence of significant celiac artery stenosis. 2. Chronically occluded superior mesenteric artery again noted. 3. 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    CT AP 03/31/22   5.8 x 5.1 x 7.3 cm = 113 g  CTA AP 12/22/22   5.3 x 4.4 x 5.9 cm = 72 g   Widely patent celiac stent.   Labs:  CBC: Recent Labs    04/19/22 0727 06/22/22 0730  WBC 11.2* 10.5  HGB 14.3 14.6  HCT 42.4 43.9  PLT 396 357    COAGS: Recent Labs    04/19/22 0727 06/22/22 0822  INR 1.0 1.0    BMP: Recent Labs    04/19/22 0727 06/22/22 0822 12/22/22 1443  NA 139 138  --   K 3.6 3.9  --   CL 103 103  --   CO2 29 26  --   GLUCOSE 104* 96  --   BUN 14 17  --   CALCIUM 9.1 8.8*  --   CREATININE 1.11 1.05 1.30*  GFRNONAA >60 >60  --     LIVER FUNCTION TESTS:  No results for input(s): "BILITOT", "AST", "ALT", "ALKPHOS", "PROT", "ALBUMIN" in the last 8760  hours.  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  He is recovering very well clinically, remaining compliant with DAPT, with resolution of abdominal pain and ability to gain weight.  Stent remains patent on CTA.    He is now status post prostate artery embolization (06/22/22) for his benign prostatic hyperplasia  with severe lower urinary tract symptoms that requires self-catheterization. The prostate gland demonstrates approximately 38% volume reduction (113 --> 72 g) on recent CTA from prior to PAE.  Unfortunately, he is still requiring frequent self-catheterizations.  He is not taking flomax due to script refill issues.  We will refill this for him for 3 months until he is able to get it from his Urologic Oncologist at [facility].   -Flomax 0.4 mg daily for 3 months -Continue DAPT -Plan for CTA abdomen/pelvis in 1 year.  Follow up in IR clinic once obtained.    Electronically Signed: Thressa Sheller Caio Devera 01/04/2023, 2:05 PM   I spent a total of 40 Minutes in virtual telephone clinical consultation, greater than 50% of which was counseling/coordinating care for chronic mesenteric ischemia and benign prostatic hyperplasia.

## 2023-01-04 NOTE — Telephone Encounter (Signed)
Tamsulosin 0.4 mg one tablet daily by mouth x 30 days with 2 refills e-prescribed to the [facility] on [address]  [place]wyn Ren, [place] [phone] 01/04/2023, 2:36 PM

## 2023-02-14 IMAGING — CT CT ABD-PEL WO/W CM
2 of 9 series · 10 of 46 positions shown, 16 images · IV contrast (agent unspecified)
Comparison: None.

CLINICAL DATA: Unintentional weight loss. History of Hodgkin's
disease. * Tracking Code: BO *

EXAM:
CT ABDOMEN AND PELVIS WITHOUT AND WITH CONTRAST
TECHNIQUE: Multidetector CT imaging of the abdomen and pelvis was performed
following the standard protocol before and following the bolus
administration of intravenous contrast.

[Series 3: axial st · axial · 0.93mm/px · z∈[+963,+1368]mm · 7 of 109 slices shown, 12 images]
[im 14/109  soft-tissue]
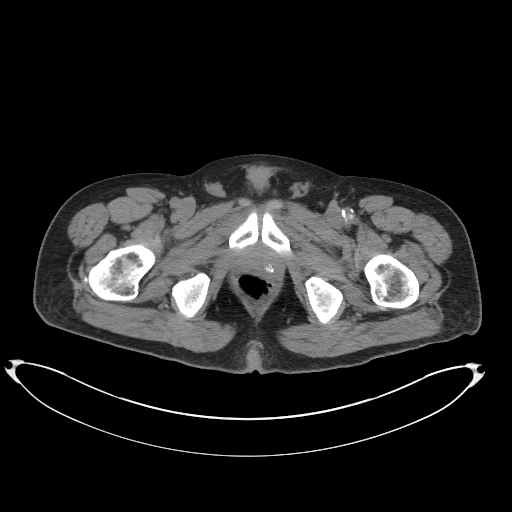
[im 14/109  bone]
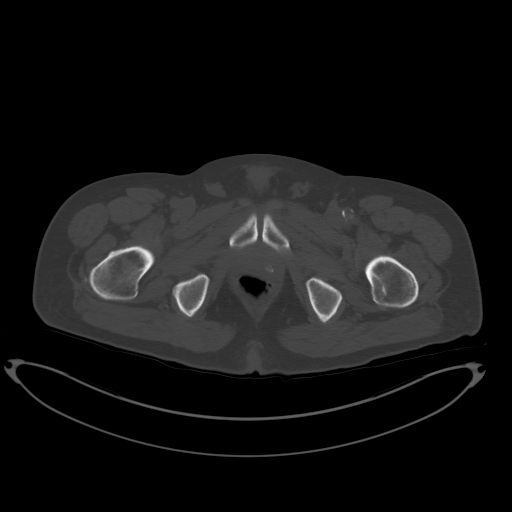
[im 28/109  soft-tissue]
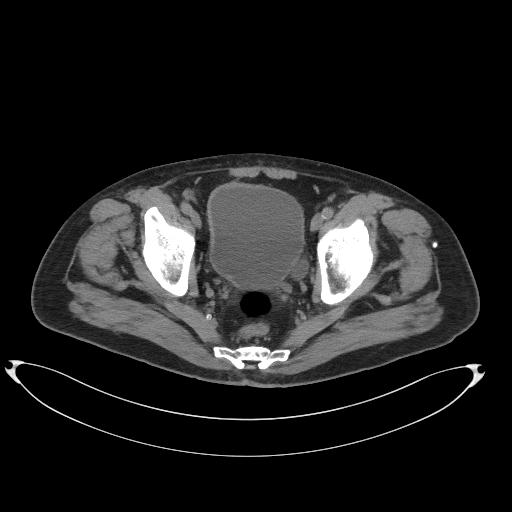
[im 41/109  soft-tissue]
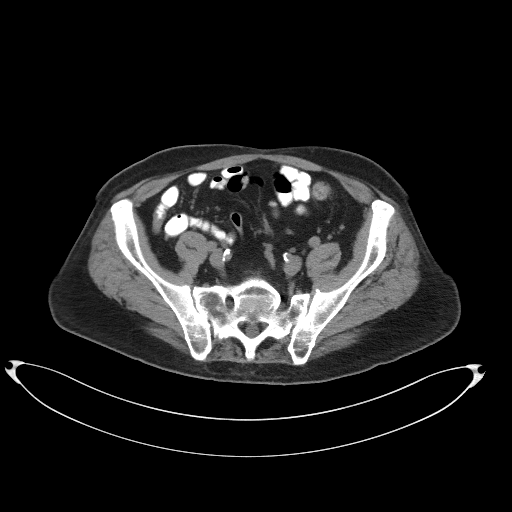
[im 55/109  soft-tissue]
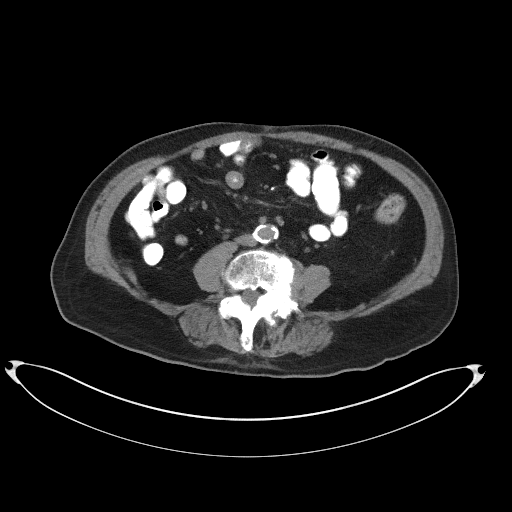
[im 55/109  lung]
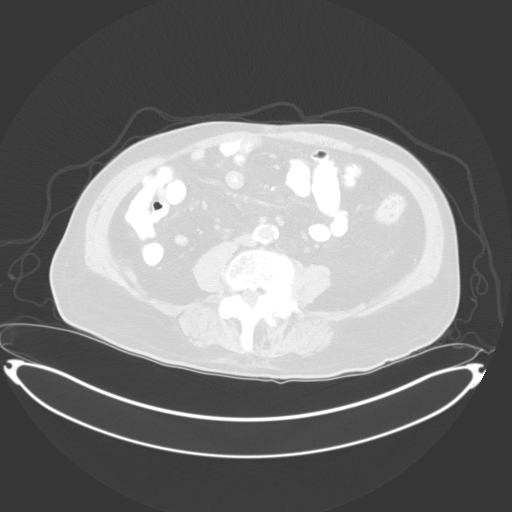
[im 68/109  soft-tissue]
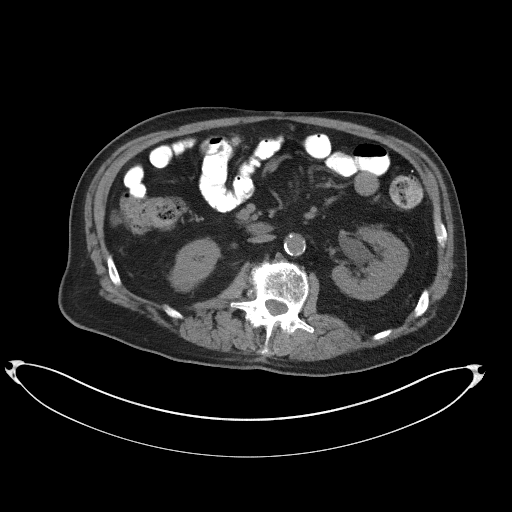
[im 68/109  lung]
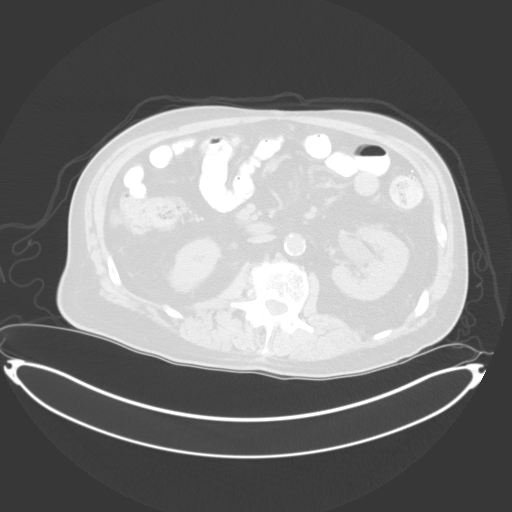
[im 82/109  soft-tissue]
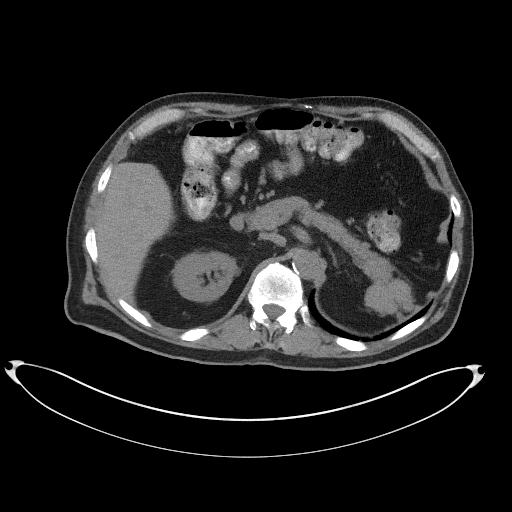
[im 82/109  lung]
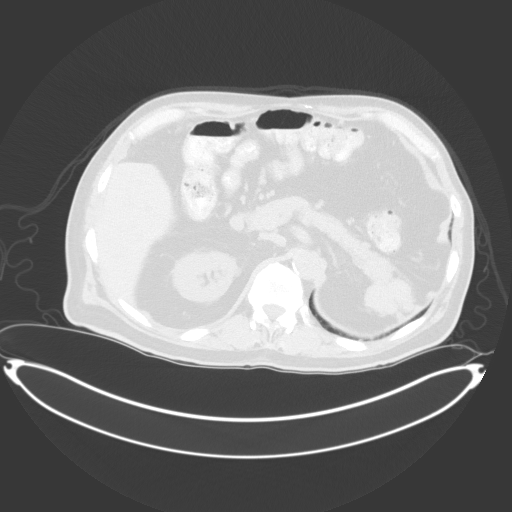
[im 95/109  soft-tissue]
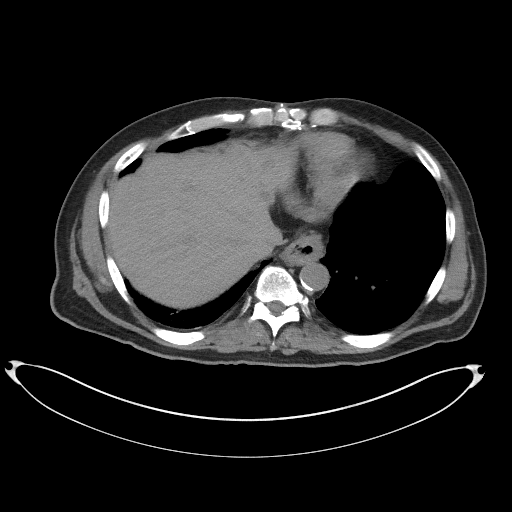
[im 95/109  lung]
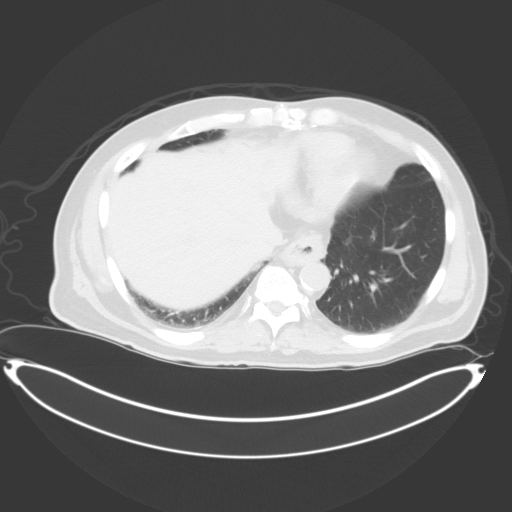

[Series 10: coronal st · coronal · 0.85mm/px · 3 of 129 slices shown, 4 images]
[im 26/129  soft-tissue]
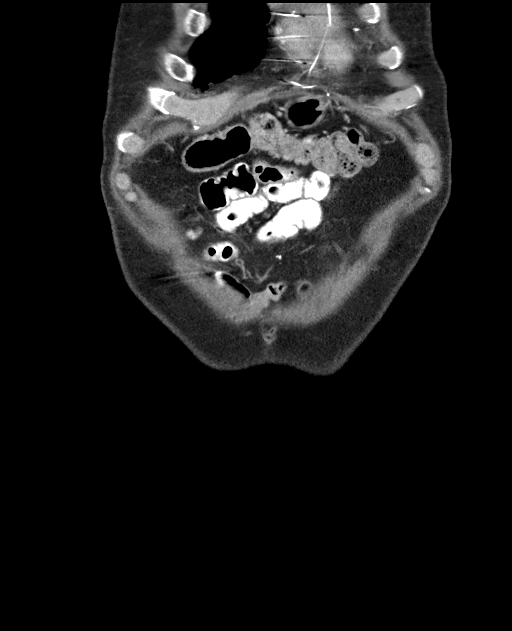
[im 52/129  soft-tissue]
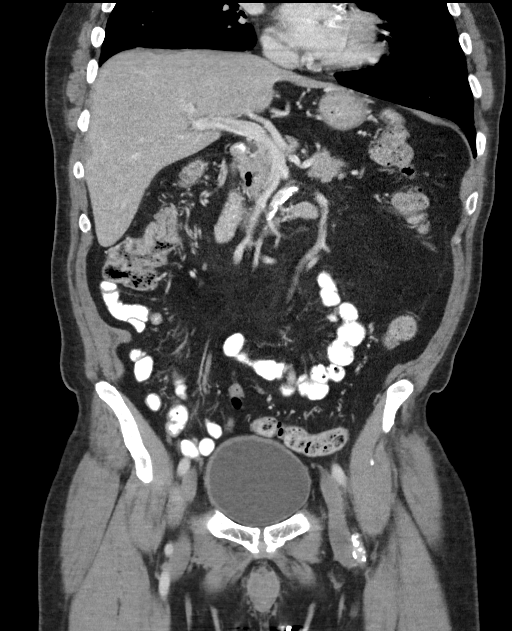
[im 52/129  bone]
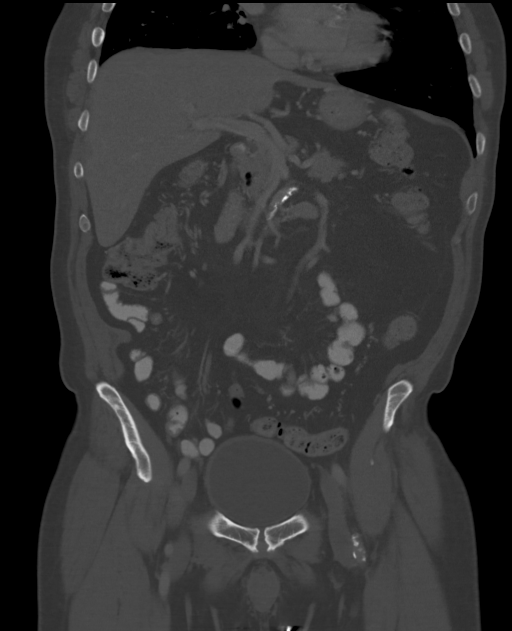
[im 77/129  soft-tissue]
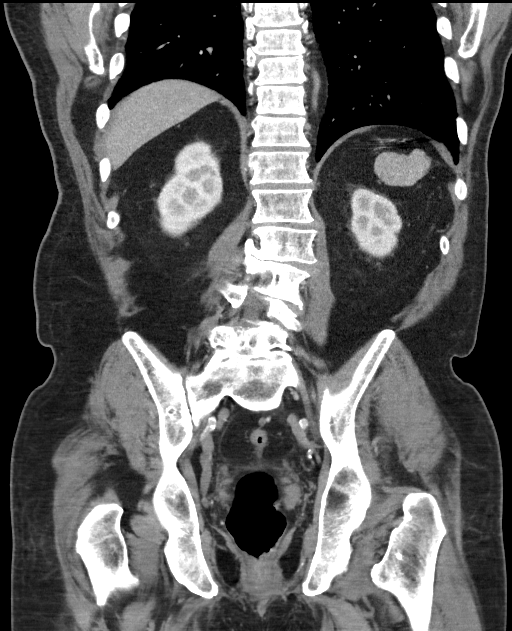

[10 of 46 positions shown; findings below may reference images not displayed]

RADIATION DOSE REDUCTION: This exam was performed according to the
departmental dose-optimization program which includes automated
exposure control, adjustment of the mA and/or kV according to
patient size and/or use of iterative reconstruction technique.

CONTRAST:  100mL OMNIPAQUE IOHEXOL 300 MG/ML  SOLN
FINDINGS: Lower chest: Unremarkable.

Hepatobiliary: No suspicious focal abnormality in the liver on this
study without intravenous contrast. Gallbladder surgically absent.
No intrahepatic or extrahepatic biliary dilation.

Pancreas: No focal mass lesion. No dilatation of the main duct. No
intraparenchymal cyst. No peripancreatic edema.

Spleen: Prior splenectomy with multiple splenules in the left upper
quadrant.

Adrenals/Urinary Tract: No adrenal nodule or mass. Kidneys
unremarkable. No evidence for hydroureter. Left-sided bladder
diverticulum noted. Soft tissue attenuation projecting into the
bladder base is probably median lobe hypertrophy of the prostate
gland.

Stomach/Bowel: Small hiatal hernia. Stomach otherwise unremarkable.
Duodenum is normally positioned as is the ligament of Treitz.
Duodenal diverticulum noted. No small bowel wall thickening. No
small bowel dilatation. The terminal ileum is normal. The appendix
is not well visualized, but there is no edema or inflammation in the
region of the cecum. No gross colonic mass. No colonic wall
thickening. Diverticular changes are noted in the left colon without
evidence of diverticulitis.

Vascular/Lymphatic: There is moderate atherosclerotic calcification
of the abdominal aorta without aneurysm. There is no gastrohepatic
or hepatoduodenal ligament lymphadenopathy. No retroperitoneal or
mesenteric lymphadenopathy. No pelvic sidewall lymphadenopathy.

Reproductive: Prostate gland is enlarged. As noted above, soft
tissue projecting into the inferior bladder is probably related to
median lobe hypertrophy.

Other: No intraperitoneal free fluid.

Musculoskeletal: No worrisome lytic or sclerotic osseous
abnormality.
IMPRESSION: 1. No acute findings in the abdomen or pelvis. Specifically, no
findings to explain the patient's history of weight loss.
2. Prior splenectomy with multiple splenules in the left upper
quadrant.
3. Soft tissue projecting into the inferior bladder is probably
median lobe hypertrophy of the prostate gland. Urothelial lesion
considered less likely but urology follow-up may be warranted.
4. Small hiatal hernia.
5. Left colonic diverticulosis without diverticulitis.
6. Aortic Atherosclerosis ([id number]).

## 2023-03-15 NOTE — Progress Notes (Signed)
 [facility] Cystoscopy and Time Out  Assisted provider with procedure - Cystoscopy.  Procedural Time Out performed by Sam, RN and Maree, MD.  1. Correct patient 2. Correct side and site 3. Correct procedure 4. Correct position 5. Availability of special equipment and imaging studies  Pre-cystoscopy teaching completed and patient/caregiver verbalized understanding.   Patient provided with information for post-cystoscopy home care.   Cystoscopy consent reviewed with patient.    Cystoscope number 11 used for procedure. Assistant present for procedure.   Patient tolerated procedure without difficulty.   Reviewed signs and symptoms to report and triage contact information provided.

## 2023-03-15 NOTE — Procedures (Signed)
 Office Cystoscopy Procedure Note  Indication:   Urothelial carcinoma of the bladder  Informed consent  The risks, benefits, complications, treatment options, and expected outcomes were discussed with the patient. The patient concurred with the proposed plan and provided informed consent.  Anesthesia Lidocaine  jelly 2%  Antibiotic prophylaxis  None  Procedure The patient was placed in the supine position, was prepped and draped in the usual manner using sterile technique, and 2% lidocaine  jelly instilled into the urethra.  A 17 F flexible cystoscope was then inserted into the urethra and the urethra and bladder carefully examined.  

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
## 2023-04-18 ENCOUNTER — Other Ambulatory Visit (HOSPITAL_COMMUNITY): Payer: Self-pay | Admitting: Internal Medicine

## 2023-05-31 ENCOUNTER — Encounter: Payer: Self-pay | Admitting: Gastroenterology

## 2023-05-31 ENCOUNTER — Ambulatory Visit (INDEPENDENT_AMBULATORY_CARE_PROVIDER_SITE_OTHER): Payer: Medicare Other | Admitting: Gastroenterology

## 2023-05-31 VITALS — BP 132/75 | HR 92 | Temp 98.7°F | Ht 71.0 in | Wt 208.0 lb

## 2023-05-31 DIAGNOSIS — K219 Gastro-esophageal reflux disease without esophagitis: Secondary | ICD-10-CM

## 2023-05-31 MED ORDER — LANSOPRAZOLE 30 MG PO CPDR: 30.0000 mg | DELAYED_RELEASE_CAPSULE | Freq: Two times a day (BID) | ORAL | 1 refills | Status: DC

## 2023-05-31 NOTE — Patient Instructions (Signed)
 Stop pantoprazole.  Start lansoprazole 30mg  daily before breakfast and before evening meal. You can still use pepcid up to twice daily as needed for breakthrough symptoms.  Let me know if symptoms don't settle down. You can try to wean back down on the lansoprazole to just once daily (probably best before evening meal) in 3 months IF your symptoms are well controlled.  Return office visit as needed.

## 2023-05-31 NOTE — Progress Notes (Signed)
 GI Office Note    Referring Provider: Alinda Deem, MD Primary Care Physician:  Alinda Deem, MD  Primary Gastroenterologist: Roetta Sessions, MD   Chief Complaint   Chief Complaint  Patient presents with   Gastroesophageal Reflux    Currently takes pantoprazole bid but states that it is not working. Was on esomeprazole and states that it worked fine but was told to come off of it due to being on Plavix.     History of Present Illness   Matthew Butler is a 78 y.o. male presenting today for GERD.  Last seen December 2023.  History of chronic intermittent epigastric pain, early satiety, GERD.  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  Patient was evaluated by IR and underwent celiac artery stenting.  Follow-up study in September 2024 with patent stent.  Today:  Having a lot of issues with reflux. Since having celiac artery stent placement, he had to come off esomeprazole due to risk of decreasing efficacy of Plavix. He went back to pantoprazole but it is not controlling his symptoms. He is having a lot of night time symptoms. Allows 6+ hours between eating and laying down. Will wake up coughing, strangling, terrible burning. Having to sleep upright in a chair to get relief. Added pepcid at night time but not really helping. States he did very well on esomeprazole BID before he had to stop it. Denies abdominal pain. Bowels are kind of all over the place depending on what he eats. No melena, brbpr.    CTA abdomen pelvis September 2024 IMPRESSION: VASCULAR 1. Well-positioned in widely patent celiac artery stent. No evidence of recurrent stenosis or in stent stenosis. 2. Interval coil embolization of the right prostatic artery. 3. 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 4. Scattered atherosclerotic plaque throughout the aorta and branch arteries. Aortic Atherosclerosis (ICD10-I70.0). NON-VASCULAR 1. Overall decreased volume of the prostate gland consistent with history of prior prostatic artery of the incision. 2. No acute abnormality in the abdomen or pelvis. 3. Ancillary findings as above without significant interval change.    EGD June 2023: normal esophagus, medium-sized hiatal hernia, normal duodenum.    Colonoscopy June 2023: four 4-6 mm polyps in sigmoid and splenic flexure, diverticulosis, non-bleeding internal hemorrhoids. Hyperplastic. 5 year surveillance if health permits.   Wt Readings from Last 3 Encounters:  05/31/23 208 lb (94.3 kg)  08/30/22 200 lb 6.4 oz (90.9 kg)  06/22/22 190 lb (86.2 kg)       Medications   Current Outpatient Medications  Medication Sig Dispense Refill   aspirin EC 81 MG tablet Take 81 mg by mouth daily.     clopidogrel (PLAVIX) 75 MG tablet Take 1 tablet (75 mg total) by mouth daily. 90 tablet 4   famotidine (PEPCID) 20 MG tablet Take 20 mg by mouth at bedtime.     lansoprazole (PREVACID) 30 MG capsule Take 1 capsule (30 mg total) by mouth 2 (two) times daily before a meal. 60 capsule 1   nitroGLYCERIN (NITROSTAT) 0.4 MG SL tablet Place 0.4 mg under the tongue every 5 (five) minutes x 3 doses as needed for chest pain.      predniSONE (DELTASONE) 5 MG tablet Take 5 mg by mouth daily.  1   simvastatin (ZOCOR) 40 MG  tablet Take 40 mg by mouth at bedtime.      tamsulosin (FLOMAX) 0.4 MG CAPS capsule Take 0.4 mg by mouth daily.     No current facility-administered medications for this visit.    Allergies   Allergies as of 05/31/2023   (No Known Allergies)        Review of Systems   General: Negative for anorexia, weight loss, fever, chills, fatigue, weakness. ENT: Negative for hoarseness, difficulty swallowing , nasal congestion. CV: Negative for chest pain,  angina, palpitations, dyspnea on exertion, peripheral edema.  Respiratory: Negative for dyspnea at rest, dyspnea on exertion, cough, sputum, wheezing.  GI: See history of present illness. GU:  Negative for dysuria, hematuria, urinary incontinence, urinary frequency, nocturnal urination.  Endo: Negative for unusual weight change.     Physical Exam   BP 132/75 (BP Location: Right Arm, Patient Position: Sitting, Cuff Size: Large)   Pulse 92   Temp 98.7 F (37.1 C) (Oral)   Ht 5\' 11"  (1.803 m)   Wt 208 lb (94.3 kg)   SpO2 93%   BMI 29.01 kg/m    General: Well-nourished, well-developed in no acute distress.  Eyes: No icterus. Mouth: Oropharyngeal mucosa moist and pink  Abdomen: Bowel sounds are normal, nontender, nondistended, no hepatosplenomegaly or masses,  no abdominal bruits or hernia , no rebound or guarding.  Rectal: not performed  Extremities: No lower extremity edema. No clubbing or deformities. Neuro: Alert and oriented x 4   Skin: Warm and dry, no jaundice.   Psych: Alert and cooperative, normal mood and affect.  Labs   Labs from December 2024: Blood cell count 10,800, hemoglobin 15.4, platelets 363,000, glucose 99, creatinine 1.07, albumin 4, total bilirubin 0.3, alk phos 54, AST 16, ALT 18, A1c 6.2. Imaging Studies   No results found.  Assessment/Plan:   GERD: -refractory symptoms off esomeprazole, with mostly nocturnal symptoms. Went back to pantoprazole BID and with famotidine but continues to have frequent night time symptoms -will switch to lansoprazole 30mg  twice daily before breakfast and evening meal. -he can continue famotidine bid for breakthrough symptoms -reinforced antireflux symptoms.  -he will let me know if symptoms persists -if doing well at 3 months, he can try reducing lansoprazole to evening dose only    Leanna Battles. Melvyn Neth, MHS, PA-C [facility]

## 2023-07-13 ENCOUNTER — Telehealth (HOSPITAL_COMMUNITY): Payer: Self-pay | Admitting: Student

## 2023-07-13 MED ORDER — CLOPIDOGREL BISULFATE 75 MG PO TABS: 75.0000 mg | ORAL_TABLET | Freq: Every day | ORAL | 4 refills | Status: DC

## 2023-07-13 NOTE — Telephone Encounter (Signed)
 Plavix 75 mg daily refilled. Medication was e-prescribed.   Alwyn Ren, AGACNP-BC 07/13/2023, 3:32 PM

## 2023-09-08 ENCOUNTER — Other Ambulatory Visit: Payer: Self-pay | Admitting: Gastroenterology

## 2023-10-02 NOTE — Procedures (Signed)
 Operative Note   DATE OF PROCEDURE:  10/02/2023   PRE-OP DIAGNOSES:  1) Bladder tumor   POST-OP DIAGNOSES AND OPERATIVE FINDINGS:  As above  PROCEDURES: 1) Transurethral resection of bladder tumor, blue light 2)  Immediate postoperative instillation of Gemcitabine   SURGEONS:  Surgeons and Role:    * Maree Raynaldo Erm, MD - Primary    * Les Massie Pac, MD  ANESTHESIOLOGY:  Anesthesiologist: Hannah Elsie Carrier, MD Anesthesia Care Provider: Zenaida Powell Helling, CRNA  STAFF:  Circulator: Rilla Volney Fellows, RN Scrub Person: Cleotilde Ishikawa Surgical Attendant: Rayetta Aureliano Dover   ANESTHESIA TYPE: General anesthesia  ESTIMATED BLOOD LOSS:  Minimal  COMPLICATIONS:  None  ANTIBIOTICS: Cefazolin   VTE PROPHYLAXIS: Pneumatic compression stockings  PROCEDURE SUMMARY The patient was brought to the operating room and anesthesia obtained.  The patient was then placed in the lithotomy position and prepped and draped using standard sterile technique.  All pressure points were carefully padded.  A surgical time-out occurred, antibiotics were administered, and thromboembolism prophylaxis was given.    A 27 F saline resectoscope was inserted into the urethra after dilation of the urethral meatus to 28 F using standard urethral sounds. 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   The bladder was emptied and a 18 F Foley catheter inserted into the bladder and the case terminated.   DISPOSITION:  PACU - hemodynamically stable.  SPECIMENS:  ID Type Source Tests Collected by Time Destination  1 : Bladder Tumor Flat Tissue-Pathology Tissue PATHOLOGY - GENERAL / OTHER Maree Raynaldo Erm, MD 10/02/2023 1712      IMPLANTS: * No implants in log *  ATTESTATION:  FR- Surgery - (Entire alt) - I was present throughout the entire procedure as documented in my operative note (FR)

## 2023-10-02 NOTE — Care Plan (Signed)
  Problem: Risk for fluid volume imbalance (PREOP, INTRAOP, POST OP) Goal: Preop fluid/electrolyte/acid-base balances remain at basline Description: The patient's fluid, electrolyte and acid-base balances are consistent with or improved from baseline levels established pre-operatively. Outcome: Met/ Completed   Problem: Patient at risk for Hypothermia (INTRAOP, POST OP) Goal: Normothermia at the end of the immediate postop period Description: The patient is at or returning to normothermia at the conclusion of the immediate postoperative period. Outcome: Met/ Completed   Problem: Risk for allergic response to latex (PREOP, INTRAOP) Goal: Free from injury by extraneous objects. Description: The patient is free from signs and symptoms of injury caused by extraneous objects. Outcome: Met/ Completed   Problem: Knowledge deficit related to surgery and preoperative care Goal: The patient demonstrates knowledge of the expected responses Outcome: Met/ Completed   Problem: Knowledge deficit related to postoperative care  (POST OP) Goal: The patient demonstrates knowledge of the expected responses Outcome: Met/ Completed   Problem: Risk for acute pain  (PREOP, POST OP) Goal: The patient demonstrates knowledge of pain management Outcome: Met/ Completed Goal: Pt demonstrates adequate pain control in periop period. Outcome: Met/ Completed   Problem: Risk for perioperative positioning injury (INTRAOP) Goal: Pt is free from signs/symptoms of injury r/t positioning. Outcome: Met/ Completed   Problem: Risk for impaired skin integrity- (INTRAOP, POST OP) Goal: Pt free from sign/symptoms of impaired skin integrity. Outcome: Met/ Completed Goal: Pt free from signs/symptoms of injury r/t transfer/transport Outcome: Met/ Completed   Problem: Patient at risk for infection (PREOP, INTRAOP, POST OP) Goal: The patient is free from signs and symptoms of infection Outcome: Met/ Completed   Problem:  Alteration in comfort: Goal: Patient/caregiver's expressions of having a comfortable level of knowledge regarding medication regimen will improve Outcome: Met/ Completed Note: Patient satisfied with pain goal.

## 2023-10-02 NOTE — Progress Notes (Signed)
 Anesthesia Transfer of Care Note  Patient: Matthew Butler  Procedures performed: *Blue Light* TRANSURETHRAL RESECTION OF BLADDER TUMOR USING BLUE LIGHT BLADDER INSTILLATION OF ANTICARCINOGENIC AGENT UROGRAPHY, RETROGRADE, WITH OR WITHOUT KUB (Bilateral: Ureter) URETERAL CATHETERIZATION (Bilateral: Ureter)  Report given/handover to: PACU Nurse Patient transported to PACU on portable oxygen by anesthesia and surgical personnel; patient with spontaneous respirations and patent airway; pain medications immediately available; report given to receiving nurse; VSS

## 2023-10-02 NOTE — Anesthesia Postprocedure Evaluation (Signed)
 Discharge from Anesthesia Care  Patient: Matthew Butler  Procedures performed: *Blue Light* TRANSURETHRAL RESECTION OF BLADDER TUMOR USING BLUE LIGHT BLADDER INSTILLATION OF ANTICARCINOGENIC AGENT UROGRAPHY, RETROGRADE, WITH OR WITHOUT KUB (Bilateral: Ureter) URETERAL CATHETERIZATION (Bilateral: Ureter)  Anesthesia type: general Vitals Value Taken Time  BP 137/68 10/02/23 18:17  Temp 36.8 C (98.2 F) 10/02/23 18:17  Pulse 78 10/02/23 18:17  Resp 15 10/02/23 18:17  SpO2 96 % 10/02/23 18:17   *If vital value is absent from grid, please refer to corresponding flowsheet vitals data  Anesthesia Post Evaluation  Recent Flowsheet Information: No vitals data found for the desired time range.

## 2023-10-24 NOTE — Progress Notes (Signed)
 History of presenting illness    Matthew Butler is a 78 y.o. male who is here for evaluation and treatment of bladder cancer.    Current urinary and other symptoms are noted in the review of systems.  Referring provider: Self  Detailed uro-oncological history   Hodgkins lymphoma - in remission s/p whole body RT   # Bladder cancer, NMIBC, High risk. -03/17/2020 - GH, cysto - right lateral wall tumor on cysto (Dr. Polo) -04/28/20 - CT GU - No upper tract lesions of note aside from b/l cysts, right bladder wall thickening, median lobe, left wall diverticulum -05/28/2020 - TURBT + gem - HG T1, focal, MP uninvolved -07/28/2020 - 1st induction BCG x6 complete -09/23/2020 - Cysto - neg -11/04/2020 - 1st Maintenance BCG x3 complete -12/25/2020 - Cysto - neg -02/05/21 - 2nd maintenance bcg x3 complete -03/26/21 - Cysto - neg. Cytology neg. -06/23/21 - Cysto - Neg. Cytology neg. -08/05/21 - BCG x3 complete -09/29/21 - CT A/P w/wo - NED, stable left wall diverticulum. Large median lobe prostate hypertrophy. -09/29/21 - Cysto/bx - Inflammatory lesions, one more nodular, biopsy taken and reactive. Cytology neg. -01/21/22- Cysto - Neg. Cytology neg. -03/07/22 - BCG x3 complete -05/25/22 - Cysto - Neg. Cytology rare atypical -08/24/22 - CT TCC AP w/wo - NED -08/24/22 - Cysto - Neg -10/11/22 - BCG x 3 complete -03/15/23 - Cysto - neg, cytology rare atypical -04/27/23 - BCG x3 complete -09/06/23 - Cysto - erythema right posterior/trigone  # BPH / Incomplete bladder emptying +freq, +nocturia,  09/2021: prostate volume on CT. Summer 2023: started on CIC QID for incomplete emptying. No UTI. Improvement in LUTS with CIC. 01/2022: Cysto - BPH, median lobe, chronic obstructive cystopathy. 04/14/22: Visit with Dr. [facility] for HoLEP consideration. AUASS 33. No UTI. Improvement in LUTS with CIC. 06/22/22: PAE @ [place] 10/02/2023: BL TURBT, 6 cm lesion right posterior trigone with CIS appearance. Path:  CIS, MP present and uninvolved.      Tobacco exposure    Social History   Tobacco Use  Smoking Status Former  . Types: Cigars  Smokeless Tobacco Never  Tobacco Comments   Rare cigars for celebrations.   Second hand smoke from wife     Past medical and surgical history   Patient Active Problem List   Diagnosis Date Noted  . Mesenteric ischemia (CMS/HHS-HCC) 04/19/2022  . Loss of weight 03/22/2022  . Malignant neoplasm of overlapping sites of bladder (CMS/HHS-HCC) 06/12/2020  . Nuclear sclerosis 04/24/2020  . Posterior vitreous detachment 04/24/2020  . 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 Hodgkin's disease in remission 04/24/2020  . Bladder mass 04/24/2020  . Positive colorectal cancer screening using Cologuard test 02/07/2018  . Personal history of malignant melanoma of skin 01/30/2015  . Acute cholecystitis 04/16/2014  . Benign essential HTN 04/16/2014  . BPH (benign prostatic hyperplasia) 04/16/2014  . GERD (gastroesophageal reflux disease) 04/16/2014  . HLD (hyperlipidemia) 04/16/2014  . Transaminitis 04/16/2014  . History of ulcer disease 04/15/2014  . 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 Coronary atherosclerosis 08/10/2011  . Hodgkin disease (CMS/HHS-HCC) 08/10/2011   Past Medical History:  Diagnosis Date  . Acute cholecystitis 04/16/2014  . AKI (acute kidney injury) () 04/16/2014  . Arthritis   . Benign essential HTN 04/16/2014  . BPH (benign prostatic hyperplasia) 04/16/2014  . Cholecystitis with cholelithiasis 04/24/2020  . Coronary artery disease   . GERD (gastroesophageal reflux disease)   . Glaucoma suspect  04/24/2020  . History of blood transfusion   . HLD (hyperlipidemia) 04/16/2014  . Hodgkin's disease in remission 04/24/2020  . 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 Nuclear sclerosis 04/24/2020  . Posterior vitreous detachment 04/24/2020  . Transaminitis 04/16/2014    Past  Surgical History:  Procedure Laterality Date  . SPLENECTOMY  1980   and liver biopsy  . CORONARY ARTERY BYPASS W/SINGLE ARTERY GRAFT  2011  . CYSTOURETHROSCOPY W/FULGURATION &/OR RESECTION BLADDER TUMOR N/A 05/28/2020   Procedure: TRANSURETHRAL RESECTION OF BLADDER TUMOR - WHITE LIGHT;  Surgeon: Maryanne Reyes Rana, MD;  Location: [facility];  Service: Urology;  Laterality: N/A;  . INSTILLATION CHEMOTHERAPY AGENT BLADDER N/A 05/28/2020   Procedure: **Possible** - IMMEDIATE POSTOPERATIVE INTRAVESICAL CHEMOTHERAPY;  Surgeon: Maryanne Reyes Rana, MD;  Location: [facility];  Service: Urology;  Laterality: N/A;  . celiac artery stent placement  05/2022  . CYSTOURETHROSCOPY W/FULGURATION &/OR RESECTION BLADDER TUMOR N/A 10/02/2023   Procedure: *[place]* TRANSURETHRAL RESECTION OF BLADDER TUMOR USING BLUE LIGHT;  Surgeon: Maree Raynaldo Erm, MD;  Location: [facility];  Service: Urology;  Laterality: N/A;  . INSTILLATION CHEMOTHERAPY AGENT BLADDER N/A 10/02/2023   Procedure: BLADDER INSTILLATION OF ANTICARCINOGENIC AGENT;  Surgeon: Maree Raynaldo Erm, MD;  Location: [facility];  Service: Urology;  Laterality: N/A;  . URETEROGRAM RETROGRADE W/WO KUB Bilateral 10/02/2023   Procedure: UROGRAPHY, RETROGRADE, WITH OR WITHOUT KUB;  Surgeon: Maree Raynaldo Erm, MD;  Location: [facility] OR;  Service: Urology;  Laterality: Bilateral;  . CYSTOURETHROSCOPY W/URETERAL CATHIZATION W/WO RETROGRADE PYELOGRAM Bilateral 10/02/2023   Procedure: URETERAL CATHETERIZATION;  Surgeon: Maree Raynaldo Erm, MD;  Location: [facility];  Service: Urology;  Laterality: Bilateral;  . CHOLECYSTECTOMY    . meniscus repair - Left      Family history   Family History  Problem Relation Name Age of Onset  . Heart failure Mother    . Stroke Father    . Skin cancer Maternal Grandmother    . Bladder Cancer Neg Hx    . Anesthesia problems Neg Hx       Social history   Social History   Socioeconomic  History  . Marital status: Married  Occupational History  . 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 Smoking status: Former    Types: Cigars  . Smokeless tobacco: Never  . Tobacco comments:    Rare cigars for celebrations.    Second hand smoke from wife  Vaping Use  . Vaping status: Never Used  Substance and Sexual Activity  . Drug use: Never  . Sexual activity: Not Currently  Social History Narrative   Agent orange exposure - [place] - [employer]   Social Drivers of Health   Housing Stability: Unknown (04/12/2023)   Housing Stability Vital Sign   . Homeless in the Last Year: No     Current medications   Current Outpatient Medications  Medication Sig Dispense Refill  . aspirin  81 MG EC tablet Take 81 mg by mouth once daily.    . clopidogreL  (PLAVIX ) 75 mg tablet Take 75 mg by mouth once daily    . famotidine (PEPCID) 20 MG tablet Take 20 mg by mouth at bedtime    . oxyCODONE  (ROXICODONE ) 5 MG immediate release tablet Take 1 tablet (5 mg total) by mouth every 4 (four) hours as needed for Pain for up to 5 doses 5 tablet 0  . predniSONE (DELTASONE) 5 MG tablet Take 5 mg  by mouth once daily    . simvastatin  (ZOCOR ) 40 MG tablet Take 40 mg by mouth nightly.    . tamsulosin  (FLOMAX ) 0.4 mg capsule Take 1 capsule (0.4 mg total) by mouth once daily Take 30 minutes after same meal each day. 90 capsule 3  . metoprolol  succinate (TOPROL -XL) 25 MG XL tablet Take 1 tablet by mouth once daily (Patient not taking: Reported on 10/25/2023)     No current facility-administered medications for this visit.     Allergies   Patient has no known allergies.   Review of systems   General: Feels normal and well  Psychiatric: No new psychiatric symptoms  Neurologic: No new neurologic symptoms  Vision: No new vision changes  Hearing: No new hearing changes  Endocrine: No new endocrine problems  Cardiovascular: No new cardiovascular symptoms. Mesenteric ischemia, s/p IR  celiac stent placement 04/19/22. Now on Plavix .  Respiratory: No new respiratory symptoms  Gastrointestinal: No new gastrointestinal symptoms  Genitourinary: No new genitourinary symptoms. Continues on CIC QID. No improvement w/ PAE  Musculoskeletal: No new musculoskeletal problems  Hematologic: No new hematologic problems     Physical examination:    Vitals:   10/25/23 0827  BP: (!) 143/72  BP Location: Left upper arm  Patient Position: Sitting  BP Cuff Size: Adult  Pulse: 91  Resp: 16  SpO2: 96%  Weight: 94.6 kg (208 lb 8.9 oz)  Height: 182.9 cm (6' 0.01)     Performance status: ECOG: 1 - Symptomatic but completely ambulatory Karnofsky: 90  Able to carry on normal activity  Mental status: Normal mental state  Constitutional: Normal grooming and hygiene, no apparent distress.  HEENT: Atraumatic, normocephalic, nonicteric sclerae, EOMI.  Respiratory: Unlabored breathing, normal chest excursion, no use of accessory muscles of respiration.  Cardiac: Normal peripheral pulses, normal heart rate, normal heart rhythm.  Abdomen:  Soft, non-tender, no palpable masses.  Genitalia: Normal external genitalia. (Phallus normal)  DRE: Deferred  Extremities: Warm and well perfused.  Musculoskeletal: Normal range of motion, good strength and tone.  Neurologic: Cranial nerves grossly intact, no focal weakness or spasticity.  Lymphatic: Not examined    Impression:     ICD-10-CM   1. Malignant neoplasm of overlapping sites of bladder (CMS/HHS-HCC)  C67.8 Bladder Cystoscopy    CT TCC protocol incl CT abd pelv with and without contrast      78 y.o. male with history of OA, GERD, CAD, Coronary Stents, Glaucoma, HLD, Hodgkin's, Melanoma, mesenteric ischemia s/p IR celiac stent placement 04/19/22 (on Plavix ), CABG (on ASA81), and high risk NMIBC.  Patient presents for follow-up and management of his high risk nonmuscle invasive bladder cancer. Recent TURBT with CIS 7 cm.   Last positive  pathology: 09/2023: CIS Last CT imaging: 08/2022  We presented the patient with the following options for managing their clinical scenario:  1) Surveillance alone This management option would consist of careful cystoscopic follow-up with periodic imaging. The goal would be to minimize treatment burden while attempting to detect recurrence and progression events early. The risk of this approach is that the cancer could worsen during surveillance and progress to an incurable state.  2) Intravesical therapy This management option would consist of induction (6 weekly installations) of intravesical therapy followed by maintenance therapy. In general, for BCG naive high-risk NMIBC we feel that the best evidence is in support of BCG as the treatment of choice. In patients that experience recurrence after a single induction course of BCG, it is reasonable to  consider a second induction course, because the response rate is about 50%. For patients that are truly BCG-unresponsive, alternative intravesical chemotherapy agents can be used including gemcitabine, mitomycin C, valrubicin, and combination gemcitabine/docetaxel. The probability of second-line intravesical chemotherapy being effective in BCG-unresponsive patients is not high, with 1 year disease free survival in the 20-40% range (maybe better at 40-60% for gemcitabine/docetaxel), depending on patient factors and the regimen used. Newer agents that are FDA approved like Anktiva and Adstilladrin were also reviewed. The risk and benefits of intravesical therapy were discussed including bleeding, infection, cystitis and bladder shrinkage, LUTS, systemic absorption side effects, and rarely life threatening toxicities. The benefit of this approach is that it is expected to reduce recurrence and progression risk, though it does not eliminate these risks.    3) Pembrolizumab This drug was approved in January 2020 for use in BCG-unresponsive NMIBC. It is given  systemically every 3 weeks and has a higher burden of systemic toxicity, which is mainly immune related. Key adverse events include dermatitis (20%), diarrhea (20%), joint pain (10%), hypothyroidism (9%), pneumonitis (3%), colitis (2%), hepatitis (1%), adrenal insufficiency (1%), hypophysitis (0.5%), nephritis (0.5%), and other autoimmune phenomena. While many of the immune-related adverse events are reversible with steroids or immune suppression (required in ~10% of patients), some are permanent. About 40% of patients with BCG-unresponsive NMIBC will experience a complete response by 3 months but several of these initial responses are temporary and the 1-year disease-free survival is about 20% (similar to second-line intravesical chemotherapy).  4) Radical cystectomy Lastly, we discussed bladder removal as the most invasive and definitive management option. The severity of the procedure, including the perioperative risks of bleeding, infection, adjacent organ injury, impairment of sexual function, urine leaks, renal dysfunction, and other medical risks such thromboembolism and death. Urinary diversions were also discussed in detail, including the various types of diversions (conduits, continent catheterizable pouches, and neobladders) along with their risks, benefits and long term sequelae and how they apply (or don't apply) to the patient's current situation.  This surgery can be done open or robotically. The best evidence from randomized trials suggests the only benefit of robotic surgery is lower blood loss with the detriments of longer surgical times, higher cost, higher ureteroenteric stricture rate, and possibly higher positive margins.  He is not interested in a cystectomy at this time.   5) Clinical trial There are clinical trials of experimental agents for managing BCG-unresponsive NMIBC and options that are available at [facility] were presented.  The patient asked questions and all of them were  answered.  After this discussion the patient elected to pursue intravesical therapy with Gemcitabine/Docetaxel.   Will start induction Gemcitabine/Docetaxel followed by cystoscopy.  Cysto in 3 months CT scan in 3 months  Attestation Statement:   I personally saw and evaluated the patient, and participated in the management and treatment plan as documented in the resident/fellow note.  RAYNALDO NELWYN FAIRLY, MD

## 2023-11-03 ENCOUNTER — Telehealth (HOSPITAL_COMMUNITY): Payer: Self-pay | Admitting: Student

## 2023-11-03 NOTE — Telephone Encounter (Signed)
 Patient with a history of celiac artery stent placement 04/19/22 with Dr. Jennefer. Patient takes 81 mg aspirin  and 75 mg Plavix  daily. Patient is now undergoing treatment at [facility] for bladder cancer and the patient is requesting to discontinue Plavix . He was told by his Urologist, Dr. Loreli, that if he develops hematuria he may not be able to continue with bladder cancer treatment.   Per Dr. Jennefer, patient ok to discontinue Plavix  but he must remain on 81 mg aspirin . If the patient develops any abdominal pain concerning for stent occlusion he is to call our office as soon as possible so imaging can be ordered. Patient agreeable to this plan.   Matthew Butler, AGACNP-BC 11/03/2023, 3:46 PM

## 2023-11-15 ENCOUNTER — Telehealth: Payer: Self-pay

## 2023-11-15 MED ORDER — ESOMEPRAZOLE MAGNESIUM 40 MG PO CPDR
DELAYED_RELEASE_CAPSULE | ORAL | 2 refills | Status: AC
Start: 2023-11-15 — End: ?

## 2023-11-15 NOTE — Telephone Encounter (Signed)
 Reviewed notes from IR 11/03/23. Patient requested coming off Plavix  due to need for bladder cancer treatment and concern that treatment will be d/c if he has active hematuria. IR ok'd for him to d/c Plavix .   He can go back on esomeprazole . I will send in rx.

## 2023-11-15 NOTE — Telephone Encounter (Signed)
 Pt called stating that he is no longer on blood thinners and is wanting to know if he can go back on esomeprazole . Please advise.

## 2023-11-15 NOTE — Addendum Note (Signed)
 Addended by: EZZARD SONNY RAMAN on: 11/15/2023 09:49 PM   Modules accepted: Orders

## 2023-11-16 ENCOUNTER — Other Ambulatory Visit: Payer: Self-pay | Admitting: Gastroenterology

## 2023-11-16 NOTE — Telephone Encounter (Signed)
 Pt was made aware.

## 2023-12-12 NOTE — Progress Notes (Signed)
 D: Pt presents to [facility] for Treatment Number 5 of Gemcitabine/Docetaxel for Induction.   Pt remained in clinic for last treatment. Pt reports after discharge with last treatment they experienced no complaints.   AAOx4.  No complaints of SOB or N/V noted or claimed. Pt denies distress. Pt arrives w/wife. Pt denies c/o gross hematuria (visible blood in urine), fever greater or equal to 39.0 C, fever 38.0 C and lasting > 48 hours, severe cystitis symptoms (frequency, urgency, burning), severe flu-like symptoms (fatigue, malaise, muscle tenderness), and bladder biopsy or TURBT within 2 weeks. Patient is continent. Patient has No allergy to latex  A: A Sterile Field was established and the urethra was cleaned with Betadine . A Lidocaine  Urojet given as directed and allowed to dwell for 3 minutes per MAR. A 16 F Coude catheter was placed without resistance and (+) urine return was noted. Treatment Number 5 for Induction Given as ordered.  R: Patient tolerated treatment well. The catheter was removed; Tip and Balloon were intact. Patient remained in clinic for 90 minute dwell time with catheter in place. Patient discharged after Docetaxel instillation. Patient discharged home with home care instruction. NAD noted or claimed. DC to Home w/wife.

## 2023-12-19 NOTE — Progress Notes (Signed)
 D: Pt presents to [facility] for Treatment Number 6 of Gemcitabine/Docetaxel for Induction.   Pt remained in clinic for last treatment for Gemzar. Pt discharged after Docetaxel instillation and reports holding the medication for 90 min at home. Pt reports after discharge with last treatment they experienced no complaints.   AAOx4.  No complaints of SOB or N/V noted or claimed. Pt denies distress. Pt arrives w/spouse. Pt denies c/o gross hematuria (visible blood in urine), fever greater or equal to 39.0 C, fever 38.0 C and lasting > 48 hours, severe cystitis symptoms (frequency, urgency, burning), severe flu-like symptoms (fatigue, malaise, muscle tenderness), and bladder biopsy or TURBT within 2 weeks. Patient is continent. Patient has No allergy to latex  A: A Sterile Field was established and the urethra was cleaned with Betadine . A Lidocaine  Urojet given as directed and allowed to dwell for 3 minutes per MAR. A 16 F Coude catheter was placed without resistance and (+) urine return was noted. Treatment Number 6 for Induction Given as ordered.  R: Patient tolerated treatment well. The catheter was removed; Tip and Balloon were intact. Patient remained in clinic for 90 minute dwell time with catheter in place. Patient discharged after Docetaxel instillation. Patient discharged home with home care instruction. NAD noted or claimed. DC to Home w/spouse.

## 2024-01-17 ENCOUNTER — Other Ambulatory Visit: Payer: Self-pay | Admitting: Interventional Radiology

## 2024-01-17 DIAGNOSIS — N401 Enlarged prostate with lower urinary tract symptoms: Secondary | ICD-10-CM

## 2024-02-22 ENCOUNTER — Other Ambulatory Visit (HOSPITAL_COMMUNITY)

## 2024-02-23 NOTE — Progress Notes (Signed)
 Referring Physician(s): Therisa Stager, NP   Chief Complaint: The patient is seen in follow up today s/p celiac artery stent placement 04/19/22 and prostate artery embolization 06/22/22.   History of present illness:  Matthew Butler, 78 year old male, has a medical history significant for HTN, Hodgkin disease, CAD, chronic mesenteric ischemia, bladder cancer and benign prostatic hyperplasia. He was initially referred to Interventional Radiology in January 2024 for episodic post-prandial pain and findings of mesenteric ischemia on CTA abdomen/pelvis. We met in consultation 04/06/22 and discussed the pathophysiology and natural history of chronic mesenteric ischemia, treatment options including medical, surgical, and endovascular. We discussed the periprocedural expectations of endovascular repair including starting dual antiplatelet therapy. I recommended recanalization of the SMA with covered stent placement, with possible additional stent placement in the celiac. He was agreeable to proceed and underwent mesenteric angiogram with celiac artery stent placement 04/19/22. The SMA demonstrated chronic long segment occlusion which was unable to be recanalized. After the procedure he was started on 75 mg Plavix  daily.   At his one month follow up visit imaging demonstrated a patent celiac stent and the patient reported zero abdominal pain. He was very pleased with the procedure results. At the time of the procedure [name] mentioned that he required intermittent self-catheterization due to BPH. He had a decade-long history of lower urinary tract symptoms and was interested in prostate artery embolization. He had been working with [facility] on symptom management but his LUTS became so severe he required CIC at least 4x daily and once at night.   We reviewed the procedure details, risks and benefits of PAE and the patient was agreeable to proceed. One 06/22/22 he underwent bilateral prostate artery embolization and  his ability to urinate improved post-procedure but he still required occasional intermittent catheterization. At the time of our last follow up 01/04/23 he required self-catheterization 3-4 x daily with nocturia x 2. From an abdominal standpoint he was doing well and denied abdominal pain or post-prandial pain.  We made plans to follow up in one year with a CTA abdomen/pelvis followed by a clinic visit. His imaging study was completed 01/31/24 at [facility] and he presents today to discuss these results. He continues to follow with [facility] for his bladder cancer therapy and per their recommendations he has discontinued Plavix . He remains on Aspirin  81 mg.  Past Medical History:  Diagnosis Date   Bladder cancer (HCC)    BPH (benign prostatic hyperplasia)    Chronic mesenteric ischemia    s/p celiac artery stenting   Coronary artery disease 2011   GERD (gastroesophageal reflux disease)    History of pulmonary embolus (PE)    After coronary artery bypass graft   Hodgkin disease (HCC)    1980s, status post splenectomy and radiation   HTN (hypertension)    Hyperlipidemia     Past Surgical History:  Procedure Laterality Date   APPENDECTOMY     BIOPSY  03/06/2020   Procedure: BIOPSY;  Surgeon: Shaaron Lamar HERO, MD;  Location: [facility];  Service: Endoscopy;;   CHOLECYSTECTOMY N/A 04/18/2014   Procedure: LAPAROSCOPIC CHOLECYSTECTOMY;  Surgeon: Oneil DELENA Budge, MD;  Location: [facility];  Service: General;  Laterality: N/A;   COLONOSCOPY N/A 02/28/2018   Rourk: Diverticulosis.  Numerous tubular adenomas removed.  Surveillance colonoscopy advised for November 2022.   COLONOSCOPY WITH PROPOFOL  N/A 09/02/2021   Procedure: COLONOSCOPY WITH PROPOFOL ;  Surgeon: Shaaron Lamar HERO, MD;  Location: [facility];  Service: Endoscopy;  Laterality: N/A;  10:15am   CORONARY ANGIOPLASTY WITH STENT PLACEMENT  2018   CORONARY ARTERY BYPASS GRAFT  2011   ESOPHAGOGASTRODUODENOSCOPY N/A 03/06/2020   Procedure:  ESOPHAGOGASTRODUODENOSCOPY (EGD);  Surgeon: Shaaron Lamar HERO, MD;  [facility];  Service: Endoscopy;  Laterality: N/A;  11:00am   ESOPHAGOGASTRODUODENOSCOPY (EGD) WITH PROPOFOL  N/A 09/02/2021   Procedure: ESOPHAGOGASTRODUODENOSCOPY (EGD) WITH PROPOFOL ;  Surgeon: Shaaron Lamar HERO, MD;  [facility]: [facility];  Service: Endoscopy;  Laterality: N/A;   IR 3D INDEPENDENT WKST  06/22/2022   IR 3D INDEPENDENT WKST  06/22/2022   IR ANGIOGRAM PELVIS SELECTIVE OR SUPRASELECTIVE  06/22/2022   IR ANGIOGRAM PELVIS SELECTIVE OR SUPRASELECTIVE  06/22/2022   IR ANGIOGRAM SELECTIVE EACH ADDITIONAL VESSEL  06/22/2022   IR ANGIOGRAM SELECTIVE EACH ADDITIONAL VESSEL  06/22/2022   IR ANGIOGRAM VISCERAL SELECTIVE  04/19/2022   IR AORTAGRAM ABDOMINAL SERIALOGRAM  04/19/2022   IR EMBO ARTERIAL NOT HEMORR HEMANG INC GUIDE ROADMAPPING  06/22/2022   IR IVUS EACH ADDITIONAL NON CORONARY VESSEL  04/19/2022   IR RADIOLOGIST EVAL & MGMT  04/06/2022   IR RADIOLOGIST EVAL & MGMT  05/30/2022   IR RADIOLOGIST EVAL & MGMT  07/29/2022   IR RADIOLOGIST EVAL & MGMT  01/04/2023   IR TRANSCATH PLC STENT 1ST ART NOT LE CV CAR VERT CAR  04/19/2022   IR US  GUIDE VASC ACCESS LEFT  06/22/2022   IR US  GUIDE VASC ACCESS RIGHT  04/19/2022   KNEE ARTHROPLASTY     MELANOMA EXCISION  2016   Left face   POLYPECTOMY  02/28/2018   Procedure: POLYPECTOMY;  Surgeon: Shaaron Lamar HERO, MD;  [facility]: [facility];  Service: Endoscopy;;  (colon)   POLYPECTOMY  09/02/2021   Procedure: POLYPECTOMY INTESTINAL;  Surgeon: Shaaron Lamar HERO, MD;  Location: [facility];  Service: Endoscopy;;   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  Medications: Prior to Admission medications   Medication Sig Start Date End Date Taking? Authorizing Provider  aspirin  EC 81 MG tablet Take 81 mg by mouth daily.    [provider]  esomeprazole  (NEXIUM ) 40 MG capsule Take one capsule before breakfast and can take a  second capsule before supper if needed. 11/15/23   Ezzard Sonny RAMAN, PA-C  famotidine (PEPCID) 20 MG tablet Take 20 mg by mouth at bedtime.    [provider]  nitroGLYCERIN  (NITROSTAT ) 0.4 MG SL tablet Place 0.4 mg under the tongue every 5 (five) minutes x 3 doses as needed for chest pain.     [provider]  predniSONE (DELTASONE) 5 MG tablet Take 5 mg by mouth daily. 01/29/18   [provider]  simvastatin  (ZOCOR ) 40 MG tablet Take 40 mg by mouth at bedtime.  02/24/14   [provider]  tamsulosin  (FLOMAX ) 0.4 MG CAPS capsule Take 0.4 mg by mouth daily.    [provider]     Family History  Problem Relation Age of Onset   Melanoma Maternal Grandmother    Congestive Heart Failure Mother    Deep vein thrombosis Mother    Bowel Disease Father        Volvulus   Heart attack Brother    Diabetes Brother     Social History   Socioeconomic History   Marital status: Married    Spouse name: Not on file   Number of children: 1   Years of education: Not on file   Highest education  level: Not on file  Occupational History   Occupation: Retired    Comment: Retired Company Secretary  Tobacco Use   Smoking status: Former    Types: Cigars   Smokeless tobacco: Never  Vaping Use   Vaping status: Never Used  Substance and Sexual Activity   Alcohol use: Not Currently    Alcohol/week: 0.0 standard drinks of alcohol    Comment: occassional; denied 06/10/20   Drug use: No   Sexual activity: Yes    Partners: Female    Comment: spouse  Other Topics Concern   Not on file  Social History Narrative   Not on file   Social Drivers of Health   Financial Resource Strain: Not on file  Food Insecurity: Not on file  Transportation Needs: Not on file  Physical Activity: Not on file  Stress: Not on file  Social Connections: Not on file     Vital Signs: There were no vitals taken for this visit.  Physical Exam  Imaging: 04/19/22    US  Mesenteric  05/23/22 IMPRESSION: 1. No Doppler evidence of significant celiac artery stenosis. 2. Chronically occluded superior mesenteric artery again noted. 3. 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    CT AP 03/31/22   5.8 x 5.1 x 7.3 cm = 113 g   CTA AP 12/22/22   5.3 x 4.4 x 5.9 cm = 72 g    Widely patent celiac stent.  Labs:  CBC: No results for input(s): WBC, HGB, HCT, PLT in the last 8760 hours.  COAGS: No results for input(s): INR, APTT in the last 8760 hours.  BMP: No results for input(s): NA, K, CL, CO2, GLUCOSE, BUN, CALCIUM, CREATININE, GFRNONAA, GFRAA in the last 8760 hours.  Invalid input(s): CMP  LIVER FUNCTION TESTS: No results for input(s): BILITOT, AST, ALT, ALKPHOS, PROT, ALBUMIN in the last 8760 hours.  Assessment and Plan:  78 year old male with a history of chronic mesenteric ischemia with occlusion on the SMA and high-grade stenosis of the celiac. He is status post celiac stent placement 04/19/22. He also has a history of benign prostatic hyperplasia and underwent prostate artery embolization 06/22/22.   Ester Sides, MD Pager: [phone]    I spent a total of 25 Minutes in face to face in clinical consultation, greater than 50% of which was counseling/coordinating care for chronic mesenteric ischemia and benign prostatic hyperplasia.

## 2024-02-26 ENCOUNTER — Ambulatory Visit
Admission: RE | Admit: 2024-02-26 | Discharge: 2024-02-26 | Disposition: A | Source: Ambulatory Visit | Attending: Interventional Radiology

## 2024-02-26 DIAGNOSIS — N401 Enlarged prostate with lower urinary tract symptoms: Secondary | ICD-10-CM

## 2024-02-26 HISTORY — PX: IR RADIOLOGIST EVAL & MGMT: IMG5224
